# Patient Record
Sex: Female | Born: 1960 | Race: White | Hispanic: No | Marital: Married | Smoking: Former smoker
Health system: Southern US, Community
[De-identification: ages and names within clinical notes are randomized; demographics above are authoritative.]

## PROBLEM LIST (undated history)

## (undated) DIAGNOSIS — M810 Age-related osteoporosis without current pathological fracture: Secondary | ICD-10-CM

## (undated) DIAGNOSIS — N903 Dysplasia of vulva, unspecified: Secondary | ICD-10-CM

## (undated) DIAGNOSIS — I10 Essential (primary) hypertension: Secondary | ICD-10-CM

## (undated) HISTORY — DX: Age-related osteoporosis without current pathological fracture: M81.0

## (undated) HISTORY — PX: CATARACT EXTRACTION, BILATERAL: SHX1313

## (undated) HISTORY — DX: Dysplasia of vulva, unspecified: N90.3

## (undated) HISTORY — DX: Essential (primary) hypertension: I10

---

## 1996-10-01 HISTORY — PX: TUBAL LIGATION: SHX77

## 1996-10-01 HISTORY — PX: FOOT GANGLION EXCISION: SHX1660

## 1999-05-22 ENCOUNTER — Other Ambulatory Visit: Admission: RE | Admit: 1999-05-22 | Discharge: 1999-05-22 | Payer: Self-pay | Admitting: Gynecology

## 1999-06-08 ENCOUNTER — Other Ambulatory Visit: Admission: RE | Admit: 1999-06-08 | Discharge: 1999-06-08 | Payer: Self-pay | Admitting: Gynecology

## 1999-06-08 ENCOUNTER — Encounter (INDEPENDENT_AMBULATORY_CARE_PROVIDER_SITE_OTHER): Payer: Self-pay

## 2000-06-13 ENCOUNTER — Other Ambulatory Visit: Admission: RE | Admit: 2000-06-13 | Discharge: 2000-06-13 | Payer: Self-pay | Admitting: Gynecology

## 2001-06-17 ENCOUNTER — Other Ambulatory Visit: Admission: RE | Admit: 2001-06-17 | Discharge: 2001-06-17 | Payer: Self-pay | Admitting: Gynecology

## 2002-06-03 ENCOUNTER — Other Ambulatory Visit: Admission: RE | Admit: 2002-06-03 | Discharge: 2002-06-03 | Payer: Self-pay | Admitting: Gynecology

## 2003-10-26 ENCOUNTER — Other Ambulatory Visit: Admission: RE | Admit: 2003-10-26 | Discharge: 2003-10-26 | Payer: Self-pay | Admitting: Gynecology

## 2004-12-05 ENCOUNTER — Other Ambulatory Visit: Admission: RE | Admit: 2004-12-05 | Discharge: 2004-12-05 | Payer: Self-pay | Admitting: Gynecology

## 2006-01-17 ENCOUNTER — Other Ambulatory Visit: Admission: RE | Admit: 2006-01-17 | Discharge: 2006-01-17 | Payer: Self-pay | Admitting: Gynecology

## 2006-02-12 ENCOUNTER — Ambulatory Visit: Admission: RE | Admit: 2006-02-12 | Discharge: 2006-02-12 | Payer: Self-pay | Admitting: Gynecologic Oncology

## 2006-02-26 ENCOUNTER — Ambulatory Visit (HOSPITAL_COMMUNITY): Admission: RE | Admit: 2006-02-26 | Discharge: 2006-02-26 | Payer: Self-pay | Admitting: Gynecologic Oncology

## 2006-02-26 ENCOUNTER — Encounter (INDEPENDENT_AMBULATORY_CARE_PROVIDER_SITE_OTHER): Payer: Self-pay | Admitting: *Deleted

## 2006-03-27 ENCOUNTER — Ambulatory Visit: Admission: RE | Admit: 2006-03-27 | Discharge: 2006-03-27 | Payer: Self-pay | Admitting: Gynecology

## 2006-04-17 ENCOUNTER — Ambulatory Visit: Admission: RE | Admit: 2006-04-17 | Discharge: 2006-04-17 | Payer: Self-pay | Admitting: Gynecologic Oncology

## 2006-08-13 ENCOUNTER — Ambulatory Visit: Admission: RE | Admit: 2006-08-13 | Discharge: 2006-08-13 | Payer: Self-pay | Admitting: Gynecologic Oncology

## 2006-09-03 ENCOUNTER — Ambulatory Visit (HOSPITAL_COMMUNITY): Admission: RE | Admit: 2006-09-03 | Discharge: 2006-09-03 | Payer: Self-pay | Admitting: Gynecologic Oncology

## 2006-09-03 ENCOUNTER — Encounter (INDEPENDENT_AMBULATORY_CARE_PROVIDER_SITE_OTHER): Payer: Self-pay | Admitting: *Deleted

## 2006-10-29 ENCOUNTER — Ambulatory Visit: Admission: RE | Admit: 2006-10-29 | Discharge: 2006-10-29 | Payer: Self-pay | Admitting: Gynecologic Oncology

## 2007-02-12 ENCOUNTER — Other Ambulatory Visit: Admission: RE | Admit: 2007-02-12 | Discharge: 2007-02-12 | Payer: Self-pay | Admitting: Gynecology

## 2007-12-09 ENCOUNTER — Ambulatory Visit: Admission: RE | Admit: 2007-12-09 | Discharge: 2007-12-09 | Payer: Self-pay | Admitting: Gynecologic Oncology

## 2008-03-01 ENCOUNTER — Other Ambulatory Visit: Admission: RE | Admit: 2008-03-01 | Discharge: 2008-03-01 | Payer: Self-pay | Admitting: Gynecology

## 2008-05-19 ENCOUNTER — Ambulatory Visit: Admission: RE | Admit: 2008-05-19 | Discharge: 2008-05-19 | Payer: Self-pay | Admitting: Gynecologic Oncology

## 2008-08-18 ENCOUNTER — Ambulatory Visit: Admission: RE | Admit: 2008-08-18 | Discharge: 2008-08-18 | Payer: Self-pay | Admitting: Obstetrics and Gynecology

## 2008-10-01 HISTORY — PX: CO2 LASER APPLICATION: SHX5778

## 2009-04-29 ENCOUNTER — Ambulatory Visit: Admission: RE | Admit: 2009-04-29 | Discharge: 2009-04-29 | Payer: Self-pay | Admitting: Gynecology

## 2009-10-21 ENCOUNTER — Ambulatory Visit: Admission: RE | Admit: 2009-10-21 | Discharge: 2009-10-21 | Payer: Self-pay | Admitting: Gynecology

## 2011-02-13 NOTE — Consult Note (Signed)
NAME:  [name], [name]                ACCOUNT NO.:  [id number]   MEDICAL RECORD NO.:  [id number]          PATIENT TYPE:  OUT   LOCATION:  GYN                          FACILITY:  [facility]   PHYSICIAN:  De Blanch, M.D.DATE OF BIRTH:  02-24-1961   DATE OF CONSULTATION:  DATE OF DISCHARGE:                                 CONSULTATION   CHIEF COMPLAINT:  Vulvar intraepithelial neoplasia grade III.   INTERVAL HISTORY:  The patient turns today having recently seen Dr.  Teodora Medici.  On that visit, a small white lesion was noted on the  patient's posterior left vulva.  A punch biopsy was performed.  This  shows VIN III.  The patient has had good healing from the biopsy and has  no complaints.  She specifically denies any new vulvar lesions for any  pruritus.   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  She had extensive involvement of multiple  hemorrhoids undergoing extensive laser ablation of the anus and vulva in  May 2007.  She had some regrowth of apparent condylomata and was  initially treated with Condylox for approximately 3 months.  However,  with persistent lesion, she underwent laser vaporization in December  2007.  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   PAST MEDICAL HISTORY:  Medical illnesses:  None.  The patient is not  immunosuppressed.   PAST SURGICAL HISTORY:  1. Cesarean section.  2. Tubal ligation.   CURRENT MEDICATIONS:  1. Calcium supplement.  2. Fish oil.   SOCIAL HISTORY:  The patient is married.  She smokes approximately half  pack a day.  She comes accompanied by her husband who recently had open  heart surgery.   FAMILY HISTORY:  Mother with colon cancer.  Grandmother with breast  cancer.  Father with prostate cancer.   REVIEW OF SYSTEMS:  A  10-point coverage review of systems negative  except as noted above.   PHYSICAL EXAMINATION:  VITAL SIGNS:  Weight 148 pounds.  GENERAL:  The patient is a healthy white female in no acute distress.  HEENT:  Negative.  NECK:  Supple without thyromegaly.  ABDOMEN:  Soft, nontender.  No mass, organomegaly, ascites or hernias  noted.  There is no supraclavicular or inguinal adenopathy.  PELVIC:  EG/BUS inspected carefully before and after application of  acetic acid.  There are no lesions noted.  She does have changes  consistent with laser vaporization and excellent re-epithelialization  especially on the left posterior vulva.  The punch biopsy site is  located in the middle of this area.  There are no other areas of  abnormality noted on today's exam.  Careful inspection of anus also  appears to be normal.   IMPRESSION:  Recurrent vulvar intraepithelial neoplasia.  Biopsy  confirmed by Dr. Chevis Pretty on October 30, 2007.  On today's exam.  I see no  lesions and therefore I do not have  the target lesion to excise or  ablate.  I would recommend the patient continue to follow closely at 6-  month intervals.      De Blanch, M.D.  Electronically Signed     DC/MEDQ  D:  12/09/2007  T:  12/11/2007  Job:  [id number]   cc:   Leatha Gilding. Mezer, M.D.  Fax: [phone]   Telford Nab, R.N.  [address]  [place], [place] [address]

## 2011-02-13 NOTE — Consult Note (Signed)
NAME:  [name], [name]                ACCOUNT NO.:  [id number]   MEDICAL RECORD NO.:  [id number]          PATIENT TYPE:  OUT   LOCATION:  GYN                          FACILITY:  [facility]   PHYSICIAN:  John T. Kyla Balzarine, M.D.    DATE OF BIRTH:  1960/10/18   DATE OF CONSULTATION:  05/19/2008  DATE OF DISCHARGE:                                 CONSULTATION   CHIEF COMPLAINT:  Recurrent/persistent VIN III.   INTERVAL HISTORY:  The patient recently saw Dr. Chevis Pretty and was noted to  have a small white lesion of the vulva with biopsy positive for VIN III  again.  The patient noted poor healing at the biopsy site and some  pruritus associated with that but otherwise has been symptomatic and has  not noted any lesions.   HISTORY OF PRESENT ILLNESS:  This patient has multifocal vulvar and  perineal intraepithelial neoplasia, high-grade, dating to May 2007.  She  had extensive involvement of multiple hemorrhoids, undergoing extensive  laser ablation of the anus and vulva in May 2007.  She had some regrowth  of apparent condylomata, initially treated with Condylox for  approximately 3 months; however, with persistent lesions, she underwent  laser vaporization in December 2007.  She had a followup biopsy in March  2009 with VIN III and Dr. Nelwyn Salisbury examination at that time  revealed no residual dysplasia.  She was seen by Dr. Chevis Pretty recently, and  his biopsy is as above.   PAST MEDICAL HISTORY:  No major medical illnesses or immunosuppression.  Prior cesarean section, tubal ligation and laser vaporizations as above.   MEDICATIONS:  Calcium, multiple vitamins, and fish oil.   ALLERGIES:  None known.   PERSONAL AND SOCIAL HISTORY:  Married.  Smokes 1/2 pack per day and  admits social ethanol.   FAMILY HISTORY:  Mother with colon cancer and grandmother with breast  cancer, father with prostate cancer.   REVIEW OF SYSTEMS:  Ten-point survey negative except as noted above.   PHYSICAL  EXAMINATION:  VITAL SIGNS:  Stable and afebrile as recorded in  clinic chart.  GENERAL:  The patient is alert and oriented x 3, in no acute distress.  HEENT:  ENT is negative and there is no supraclavicular or inguinal  lymphadenopathy.  BACK:  No spinous or CVA tenderness.  ABDOMEN:  Soft and benign with no hernia, ascites, mass or organomegaly.  No skin lesions of trunk or extremities.  PELVIC:  External genitalia and BUS are normal to inspection and  palpation.  Bladder and urethra are normal, including cervix.  Bimanual  and rectovaginal examinations reveal a small uterus and normal cervix  with no adnexal pathology.   Inspection of the vulva with acetic acid reveals patchy bland acetowhite  lesions with a healing biopsy site.  The anus and urethra are negative.   ASSESSMENT:  Recurrent multifocal lower genital tract dysplasia with  most recent biopsy positive for VIN III.  The patient is a smoker and  was encouraged in smoking cessation.  We plan another trial of Aldara  with reinspection in approximately 3 months and  laser vaporization if  there is no improvement.  This has approximately a 30% to 60% response  rate in treating VIN III, and it would be worthwhile to attempt to  stimulate her immune system with this treatment.      John T. Kyla Balzarine, M.D.  Electronically Signed     JTS/MEDQ  D:  05/20/2008  T:  05/20/2008  Job:  [id number]   cc:   Leatha Gilding. Mezer, M.D.  Fax: [phone]   Telford Nab, R.N.  [address]  [place], [place] [address]

## 2011-02-13 NOTE — Consult Note (Signed)
NAME:  [name], [name]                ACCOUNT NO.:  [id number]   MEDICAL RECORD NO.:  [id number]          PATIENT TYPE:  OUT   LOCATION:  GYN                          FACILITY:  [facility]   PHYSICIAN:  John T. Kyla Balzarine, M.D.    DATE OF BIRTH:  [birth year]   DATE OF CONSULTATION:  DATE OF DISCHARGE:                                 CONSULTATION   CHIEF COMPLAINT:  Recurrent/persistent VIN III.   INTERVAL HISTORY:  This patient had multifocal vulvar and  perianal/perineal intraepithelial neoplasia, high grade first diagnosed  in May 2007.  She had multiple hemorrhoidal involvement and underwent  extensive laser ablation of the anus and vulva in May 2007.  She had  some regrowth of  apparent condylomata initially treated with Condylox  for 3 months, but with persistent lesions she underwent laser  vaporization in December 2007.  She had a follow-up biopsy in March 2009  with VIN 3, but Dr. Nelwyn Salisbury examination at that time revealed  no residual dysplasia.  Biopsy per Dr. Arnell Asal in July was VIN 3.  At that  time she was found to have patchy bland acetowhite lesions with healing  biopsy site and negative anus and urethra.  I recommended topical  therapy with Aldara in an attempt to boost immune status.  The patient  was fairly compliant noticing irritation only during the last week of  her course.  She has been aware of skin smoothing out.   PAST MEDICAL HISTORY:  No major medical illnesses or immunosuppression.  Prior cesarean section, tubal ligation, and laser vaporizations as  above.   MEDICATIONS:  Calcium, multivitamins, and fish oil.   ALLERGIES:  None known.   PERSONAL AND SOCIAL HISTORY:  Married, smokes 1/2 pack per day, and  admits social ethanol.   FAMILY HISTORY:  Mother with colon cancer and grandmother with breast  cancer, father with prostate cancer.   REVIEW OF SYSTEMS:  A 10-point survey negative except as above.   PHYSICAL EXAMINATION:  VITAL SIGNS:  Stable and  afebrile as recorded in  WEXIS with weight 146 pounds.  The patient is alert and anxious, oriented x3.  LYMPH SURVEY;  Negative for pathologic lymphadenopathy.  BACK:  No spinous or CVA tenderness.  ABDOMEN:  Soft and benign with no hernia, ascites, mass or organomegaly.  No skin lesions on trunk or extremities.  PELVIC:  External genitalia and BUS normal to inspection and palpation.  Bladder and urethra are normal including the cervix.  Bimanual and  rectovaginal examinations reveal small uterus and normal cervix with no  adnexal pathology.  Close inspection of the vulva again with acetic acid  staining reveals nonspecific pattern.  There are no grossly acetowhite  lesions.   ASSESSMENT:  Recurrent multifocal lower genital tract dysplasia post  Aldara therapy.   PLAN:  We will reevaluate band makeup in the next couple of months to  determine when she could be seen.  In the meantime, I believe that she  should be evaluated by Dr. Arnell Asal in approximately 6 months, and we could  see her back for follow-up in 1  year if she does not have clinically  apparent VIN 3.  I had a long discussion with the patient and husband  regarding indications for treatment in the future, and I answered  multiple questions; they appeared satisfied with this encounter.      John T. Kyla Balzarine, M.D.  Electronically Signed     JTS/MEDQ  D:  08/18/2008  T:  08/19/2008  Job:  [id number]   cc:   Filbert Berthold, M.D.  Fax: [phone]   Telford Nab, R.N.  [address]  [place], [place] [address]

## 2011-02-13 NOTE — Consult Note (Signed)
NAME:  [name], [name]                ACCOUNT NO.:  [id number]   MEDICAL RECORD NO.:  [id number]          PATIENT TYPE:  OUT   LOCATION:  GYN                          FACILITY:  [facility]   PHYSICIAN:  De Blanch, M.D.DATE OF BIRTH:  [birth year]   DATE OF CONSULTATION:  04/29/2009  DATE OF DISCHARGE:  04/29/2009                                 CONSULTATION   CHIEF COMPLAINT:  Vulvar dysplasia.   INTERVAL HISTORY:  The patient returns today having had a vulvar biopsy  by her primary gynecologist, Dr. Teodora Medici on April 01, 2009, which  revealed vulvar intraepithelial neoplasia (warty type).   The patient herself is asymptomatic.  She specifically denies pruritus,  pain, or any lesions which she is aware of.  Apparently this lesion was  identified by Dr. Chevis Pretty upon performing colposcopy.   HISTORY OF PRESENT ILLNESS:  The patient has vulvar dysplasia first  diagnosed in May of 2007.  She underwent extensive laser vaporization of  the vulva and anus in May of 2007.  She did have some regrowth of some  condylomata which were initially treated with Condylox and then  subsequent laser vaporization in December of 2007.  Repeat biopsy in  March 2009 showed VIN 3, although no obvious lesions could be seen.  Subsequent biopsies in July of 2009 showed persistent VIN 3, which was  treated with Aldara.   PAST MEDICAL HISTORY:  Medical Illnesses:  None.  The patient denies any  use of any immunosuppressive drugs.   OBSTETRICAL HISTORY:  Gravida 1.   PAST SURGICAL HISTORY:  1. Laser vaporization of the vulva.  2. Cesarean section.  3. Bilateral tubal ligation.   CURRENT MEDICATIONS:  Calcium, multivitamins, and fish oil.   DRUG ALLERGIES:  None.   PERSONAL AND SOCIAL HISTORY:  The patient is married.  She smokes 1/2  pack per day.   FAMILY HISTORY:  Mother with colon cancer.  Grandmother with breast  cancer.  Father with prostate cancer.   REVIEW OF SYSTEMS:  A 10-point  comprehensive review of systems negative  except as noted above.   PHYSICAL EXAMINATION:  VITAL SIGNS:  Weight 149 pounds, blood pressure  130/70.  GENERAL:  The patient is a healthy white female in no acute distress.  HEENT:  Negative.  NECK:  Supple without thyromegaly.  There is no supraclavicular or  inguinal adenopathy.  ABDOMEN:  Soft, nontender.  No mass, organomegaly, are ascites are  noted.  PELVIC EXAM:  EG, BUS:  On careful examination of the vulva, there are  some very small nevi.  No obvious lesions are noted.  Vagina is clean  and well-supported.  Cervix appears normal.   PROCEDURE NOTE:  Colposcopic examination the vulva was performed after  applying acetic acid.  Once again I am unable to identify any lesions,  although I do identify the recent biopsy site performed by Dr. Chevis Pretty.   IMPRESSION:  Moderate dysplasia of the vulva on recent biopsy with no  obvious lesion at this time.  The patient is entirely asymptomatic and  therefore I would recommend that she have  a repeat examination in  approximately 6 months.      De Blanch, M.D.  Electronically Signed     DC/MEDQ  D:  05/06/2009  T:  05/06/2009  Job:  [id number]   cc:   Leatha Gilding. Mezer, M.D.  Fax: [phone]   Telford Nab, R.N.  [address]  [place], [place] [address]

## 2011-02-16 NOTE — Op Note (Signed)
NAME:  [name], [name]                ACCOUNT NO.:  [id number]   MEDICAL RECORD NO.:  [id number]          PATIENT TYPE:  AMB   LOCATION:  DAY                          FACILITY:  [facility]   PHYSICIAN:  John T. Kyla Balzarine, M.D.    DATE OF BIRTH:  06-01-1961   DATE OF PROCEDURE:  09/03/2006  DATE OF DISCHARGE:                               OPERATIVE REPORT   PREOPERATIVE DIAGNOSIS:  Multifocal vulvar and perianal intraepithelial  neoplasia.   POSTOPERATIVE DIAGNOSIS:  Anal condylomata.   PROCEDURE:  Laser ablation of multiple anal condylomata/intraepithelial  neoplasia lesions with biopsy of perianal lesion.   ANESTHESIA:  General with local infiltration of Xylocaine and Marcaine.   DESCRIPTION OF FINDINGS AND INDICATIONS FOR SURGERY:  This patient  underwent extensive laser ablation of the anus and vulva in May 2007.  She had regrowth of an apparent condyloma at approximately 3 o'clock in  the anal canal; and has been treating with Condylox prior to surgery.  On examination, there was a to 2-to-2.5-cm condyloma involving the anus  at approximately 3 o'clock, along a hemorrhoid with a seedling condyloma  at approximately 9 o'clock, a smaller seedling condyloma at 6 o'clock,  and associated acetowhite epithelium.  The acetowhite epithelium  extended towards the anal verge.  There were no acetowhite or  condylomatous lesions involving the perineum, vulva, or vaginal  introitus.   DESCRIPTION OF PROCEDURE:  The patient was prepped and draped in the low  lithotomy position using direct placement stirrups; and moistened drapes  were placed around the operative field.  The vulva and anus were  saturated with dilute acetic acid using 4 x 4 sponges with the findings  described above.  Then 10 mL of a 50:50 mix of 1% Xylocaine with 1/4%  Marcaine was injected intradermally, and under the affected areas.  The  CO2 laser using the 2 mm spot-size handpiece; and a setting of 20 watts  continuous was used  to vaporize all visible lesions.  A small biopsy  from the tip of the largest condyloma was submitted for histopathology.  All lesions were ablated to the second surgical plane with a margin of  at least 5 mm of normal skin beyond visible lesions.  This resulted in  essentially ablating the skin from approximately 2 o'clock around to 10  o'clock at the anal opening.  The patient tolerated the procedure well;  and was returned to recovery room.   ESTIMATED BLOOD LOSS:  Minimal.   SPONGE AND INSTRUMENT COUNTS:  Correct.   DRAINS AND PACKS, ETCETERA:  None.      John T. Kyla Balzarine, M.D.  Electronically Signed     JTS/MEDQ  D:  09/03/2006  T:  09/03/2006  Job:  [id number]   cc:   Leatha Gilding. Mezer, M.D.  Fax: [phone]   Telford Nab, R.N.  [address]  [place], [place] [address]

## 2011-02-16 NOTE — Consult Note (Signed)
NAME:  [name], [name]                ACCOUNT NO.:  [id number]   MEDICAL RECORD NO.:  [id number]          PATIENT TYPE:  OUT   LOCATION:  GYN                          FACILITY:  [facility]   PHYSICIAN:  John T. Kyla Balzarine, M.D.    DATE OF BIRTH:  11/04/1960   DATE OF CONSULTATION:  04/17/2006  DATE OF DISCHARGE:                                   CONSULTATION   CHIEF COMPLAINT:  Followup of vulvar and anal dysplasia.   HISTORY OF PRESENT ILLNESS:  The patient presented with multifocal vulvar  and anal intraepithelial neoplasia in May 2007.  She had extensive  involvement of multiple hemorrhoids and underwent extensive laser  vaporization of anus and vulva on Feb 26, 2006.  She had a relatively  uncomplicated postoperative course and has become aware of an apparent  condyloma at approximately 3 o'clock in the anal canal as she had an episode  of diarrhea from food poisoning or gastroenteritis.  She has resumed full  activity otherwise.   Past history, personal social history, family history are unchanged compared  to Feb 12, 2006.   REVIEW OF SYSTEMS:  Negative other than noted above.   EXAMINATION:  Weight 147 pounds.  Vital signs stable.  The patient is in no  acute distress, alert and oriented.  External genitalia and BUS reveal  complete healing of the vaporization sites with no areas of suspicious white  epithelium.  Inspection of the anus reveals external hemorrhoids.  At 3-4  o'clock there is a 1 x 1.5 cm condyloma but no others condylomatous lesions.   ASSESSMENT:  Extensive vaginal intraepithelial neoplasia and anal  intraepithelial neoplasia status post laser vaporization.  It should be  noted that her prior anal biopsy was compatible with low-grade  intraepithelial neoplasia.   PLAN:  The patient will use Condylox t.i.w. to the lesion for 2 months and  will return for evaluation approximately 1 month after she has completed  treatment.  Of note, she will be [place] for [place] in  [place] and will  not start Condylox until August.      John T. Kyla Balzarine, M.D.  Electronically Signed     JTS/MEDQ  D:  04/17/2006  T:  04/17/2006  Job:  [id number]   cc:   Leatha Gilding. Mezer, M.D.  Fax: [phone]   Telford Nab, R.N.  [address]  [place], [place] [address]

## 2011-02-16 NOTE — Consult Note (Signed)
NAME:  KALYSTA, KNEISLEY                ACCOUNT NO.:  [id number]   MEDICAL RECORD NO.:  [id number]          PATIENT TYPE:  OUT   LOCATION:  GYN                          FACILITY:  [facility]   PHYSICIAN:  De Blanch, M.D.DATE OF BIRTH:  1961/03/14   DATE OF CONSULTATION:  03/27/2006  DATE OF DISCHARGE:                                   CONSULTATION   CHIEF COMPLAINT:  Vulvar dysplasia, anal dysplasia.   INTERVAL HISTORY:  Patient underwent extensive laser vaporization of multi  focal VIN III and anal intraepithelial neoplasia on Feb 26, 2006 by Dr.  Kyla Balzarine.  She has had a relatively uncomplicated postoperative course and  feels like she is healing well.  She is not having any bleeding.  She has  minimal discomfort at the anus and introitus.  She is no longer using sitz  baths.  She has become fully active playing golf, going to the swimming pool  and other normal activities.  She has not resumed sexual intercourse.   HISTORY OF PRESENT ILLNESS:  Reviewed and documented in Dr. Calton Dach note of  Feb 12, 2006.   Past medical history, past surgical history, current medications, social  history, family history are also reviewed and unchanged from Feb 12, 2006.   REVIEW OF SYSTEMS:  10-point comprehensive review of systems is negative  except for symptoms noted above.   PHYSICAL EXAMINATION:  VITAL SIGNS:  Weight 148 pounds, blood pressure  118/76.  GENERAL:  The patient is a healthy white female in no acute distress.  PELVIC:  EGBUS shows the vulvar laser areas have healed nearly completely.  There is one area which is still granulating on the left lower labia minora.  Inspection of the anus shows multiple hemorrhoids.  There is one area which  is at approximately 5 o'clock which looks somewhat more exophytic as if  there is some persistent dysplasia.  There is also an area of granulation at  12 o'clock.   IMPRESSION:  Extensive vulvar and anal intraepithelial neoplasia status post  laser vaporization.  The patient is healing well, but not completely healed.  She will return to see Dr. Kyla Balzarine in not month for reevaluation.  It is  certainly possible that the area in the anus will require some additional  treatment if it appears to be persistent disease.   The patient's pathology report is reviewed with her.      De Blanch, M.D.  Electronically Signed     DC/MEDQ  D:  03/27/2006  T:  03/27/2006  Job:  [id number]   cc:   Leatha Gilding. Mezer, M.D.  Fax: [phone]   Telford Nab, R.N.  [address]  [place], [place] [address]

## 2011-02-16 NOTE — Consult Note (Signed)
NAME:  [name], [name]                ACCOUNT NO.:  [id number]   MEDICAL RECORD NO.:  [id number]          PATIENT TYPE:  OUT   LOCATION:  GYN                          FACILITY:  [facility]   PHYSICIAN:  John T. Kyla Balzarine, M.D.    DATE OF BIRTH:  Jun 17, 1961   DATE OF CONSULTATION:  02/12/2006  DATE OF DISCHARGE:                                   CONSULTATION   REFERRING PHYSICIAN:  Zenaida Niece, M.D.   CHIEF COMPLAINT:  This 50 year old woman is seen at the request of Dr.  Jackelyn Knife for recommendations regarding management of VIN-3 associated with  perianal condylomata.   HISTORY OF PRESENT ILLNESS:  The patient has a long-standing history of  normal cytology and has never had genital condylomata. She has colonoscopy  every 2 years because of a family history of colon cancer.  She was  asymptomatic and lesions of the anus and left posterior vulva were noted on  routine exam.  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  The  patient is asymptomatic other than hemorrhoids.   PAST MEDICAL HISTORY:  Significant for no major comorbidities and no  immunosuppressants.   PAST SURGICAL HISTORY:  1.  Status post cesarean section.  2.  BTL.   MEDICATIONS:  Calcium supplementation and fish oil.   SOCIAL HISTORY:  The patient is divorced and remarried.  She admits social  ethanol and one half pack per day tobacco use.   FAMILY HISTORY:  Significant for mother with colon cancer, grandmother with  breast cancer and father with prostate cancer.   REVIEW OF SYSTEMS:  The patient is anxious regarding her diagnosis.  She  relates spontaneous cessation of menses at age 86 and has been on no  hormonal replacement.  She has been under some psychological stress when her  son went to college, but otherwise negative in comprehensive 10 systems.   PHYSICAL EXAMINATION:  VITAL SIGNS:  Stable and afebrile as reflected in the  clinic note.  GENERAL:  The patient is alert and oriented x3 in no acute  distress.  HEENT:  Benign with clear oropharynx.  There is no scleral icterus and  extraocular movements are intact.  NECK:  Supple without goiter or bruits.  LUNGS:  The lung fields clear.  HEART:  Regular with no JVD.  BREASTS:  Breast examination deferred.  BACK:  Back is nontender with no CVA tenderness.  ABDOMEN:  The abdomen is soft and benign with well-healed midline incision.  No ascites, mass, tenderness or hernia.  EXTREMITIES:  Full strength in range of motion without tenderness, clubbing  or edema.  LYMPHS:  Lymph survey negative for pathologic lymphadenopathy.  PELVIC:  External genitalia and BUS normal to inspection and palpation with  the exception of a 1.5  x 2 cm area of leukoplakia in the posterior inner  surface of the left labia minus.  There is a cluster condyloma involving  hemorrhoidal tags without extension into the anal canal.  No ulcerative  lesions or lesions suspicious for invasion.   ASSESSMENT:  Vulvar intraepithelial neoplasia-3 and perianal condylomata.   RECOMMENDATIONS:  I had discussion with the patient and husband regarding  options for treatment.  I recommended laser vaporization over surgical  resection.  The patient is agreeable with this plan.  I discussed risks and  benefits.  Surgery will be scheduled as an outpatient in the near future.      John T. Kyla Balzarine, M.D.  Electronically Signed     JTS/MEDQ  D:  02/12/2006  T:  02/12/2006  Job:  [id number]   cc:   Zenaida Niece, M.D.  Fax: [phone]   Telford Nab, R.N.  [address]  [place], [place] [address]

## 2011-02-16 NOTE — Consult Note (Signed)
NAME:  [name], [name]                ACCOUNT NO.:  [id number]   MEDICAL RECORD NO.:  [id number]          PATIENT TYPE:  OUT   LOCATION:  GYN                          FACILITY:  [facility]   PHYSICIAN:  John T. Kyla Balzarine, M.D.    DATE OF BIRTH:  [birth year]   DATE OF CONSULTATION:  DATE OF DISCHARGE:                                 CONSULTATION   .   CHIEF COMPLAINT:  Followup after extensive perianal and vulvar laser  vaporization   HISTORY OF PRESENT ILLNESS:  This patient underwent a second laser  vaporization on December 4, treating multiple and vulvar perianal  condylomas.  Biopsy revealed focal high-grade (VIN II) and background of  low-grade vulvar and intraepithelial neoplasia with no evidence of  invasion.  She had a protracted and uncomfortable recovery because of  the perianal nature of her lesions.  These have since healed and she  presents for reevaluation.   EXAM:  Weight 148.5. Blood pressure 120/78.  Inspection of the vulva reveals a well-healed operative site.  She does  have a prominent anterior hemorrhoid but no condylomatous or acetowhite  lesions noted.  Vulvar scan is likewise normal.   ASSESSMENT:  Vulval intra-epithelial neoplasia post laser vaporization  for recurrent disease.   PLAN:  The patient reassured regarding her current evaluation.  She  should have semi-annual examinations and cytology; to spare her travel,  this could be carried out by __________.      John T. Kyla Balzarine, M.D.  Electronically Signed     JTS/MEDQ  D:  10/29/2006  T:  10/29/2006  Job:  [id number]   cc:   Telford Nab, R.N.  [address]  [place], [place] [address]   Dr. Arnell Asal

## 2011-02-16 NOTE — Op Note (Signed)
NAME:  [name], [name]                ACCOUNT NO.:  [id number]   MEDICAL RECORD NO.:  [id number]          PATIENT TYPE:  AMB   LOCATION:  DAY                          FACILITY:  [facility]   PHYSICIAN:  John T. Kyla Balzarine, M.D.    DATE OF BIRTH:  [birth year]   DATE OF PROCEDURE:  02/26/2006  DATE OF DISCHARGE:                                 OPERATIVE REPORT   SURGEON:  Ronita Hipps M.D.   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   PROCEDURE:  Laser vaporization of vulvar and perianal lesions, extensive.   ANESTHESIA:  General with LMA and local with 0.25% Marcaine.   DESCRIPTION OF FINDINGS AND INDICATIONS FOR SURGERY:  This 50 year old woman  has biopsy-documented VIN 3 involving the posterior left vulva.  Multiple  hemorrhoidal tags have condylomatous and flap lesions compatible with AIN.  The laser vaporization was performed with the CO2 laser, ablating all  visible lesions.   DESCRIPTION OF PROCEDURE:  The patient was prepped and draped using wet lap  pads and green towels in the lithotomy position.  The bladder was sterilely  catheterized for minimal urine.  After staining with acetic acid, a 3 x 4 cm  patch of acetowhite epithelium compatible with VIN 3 was seen to involve the  posterior left labia minora, majora and left lateral perineum.  In the anal  region were multiple condylomatous lesions involving hemorrhoidal tags.  The  largest was pedunculated, measuring approximately 1 cm in largest diameter  and was excised with the laser and submitted for biopsy.  All sites were  infiltrated with 0.25% Marcaine intradermally and subcutaneously.  The  individual lesions were outlined with the laser and vaporized to the second  surgical plane.  The largest perianal condylomatous lesion was  pedunculated  and the stalk was amputated with the laser.  Adjacent normal skin was  ablated, giving a margin in excess of 5 mm round each lesion.  The patient  tolerated the procedure well and was returned to the recovery room in stable  condition.  Estimated blood loss minimal.   TRANSFUSIONS:  None.   DRAINS, PACKS, ETC.:  None.   SPONGE AND INSTRUMENT COUNTS:  Correct.      John T. Kyla Balzarine, M.D.  Electronically Signed     JTS/MEDQ  D:  02/26/2006  T:  02/26/2006  Job:  [id number]   cc:   Mliss Sax, Dr.   Dagmar Hait, R.N.

## 2011-02-16 NOTE — Op Note (Signed)
NAME:  [name], [name]                ACCOUNT NO.:  [id number]   MEDICAL RECORD NO.:  [id number]          PATIENT TYPE:  OUT   LOCATION:  GYN                          FACILITY:  [facility]   PHYSICIAN:  John T. Kyla Balzarine, M.D.    DATE OF BIRTH:  [birth year]   DATE OF PROCEDURE:  DATE OF DISCHARGE:                               OPERATIVE REPORT   CHIEF COMPLAINT:  Follow-up of vulvar and anal high-grade dysplasia.   HISTORY OF PRESENT ILLNESS:  This patient presented with multifocal  vulvar and perineal intraepithelial neoplasia, high-grade in May 2007.  She had extensive involvement of multiple hemorrhoids undergoing  extensive laser ablation of the anus and vulva on Feb 26, 2006.  She had  regrowth of an apparent condyloma approximately 3 o'clock in the anal  canal and has been using Condylox t.i.w. for 3 months.   PAST HISTORY:  No major comorbidities and no immunosuppressants.  Status  post cesarean section and BTL.   MEDICATIONS:  Condylox as above, calcium supplementation and fish oil.   PERSONAL SOCIAL HISTORY:  Married, admits social ethanol and one half  pack per day tobacco use.   FAMILY HISTORY:  Mother with colon cancer, grandmother with breast  cancer and father with prostate cancer.   REVIEW OF SYSTEMS:  Other than previously noted and as above, negative  and the remainder of 10 systems exam.   Weight 149 pounds, blood pressure 118/70.  The patient is anxious, alert  and oriented x3 in no acute distress.  There is no pathologic  lymphadenopathy.  ENT:  Is clear.  LUNG:  Fields are clear.  HEART:  Sounds regular rate and rhythm without JVD.  There is no back or CVA tenderness.  ABDOMEN:  Is scaphoid soft and benign with no mass, ascites or  organomegaly and no tenderness.  EXTREMITIES:  Have full strength and range of motion without edema.  PELVIC:  External genitalia and BUS are normal to inspection and  palpation.  Inspection of the anus reveals a 2 cm condyloma  involving  the anus at approximately 3 o'clock and a small seedling condyloma  measuring 5 mm at approximately 9 o'clock.  EG and BUS normal.  Bladder  and urethra well supported and vaginal mucosa clear.  Cervix mobile  without lesions.  Bimanual and rectovaginal examinations benign.   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   PLAN:  Had a long discussion with the patient regarding options for  management and recommended laser vaporization as she had quite extensive  disease previously.  We again discussed smoking cessation.  Surgery is  tentatively scheduled for 09/03/2006.      John T. Kyla Balzarine, M.D.  Electronically Signed     JTS/MEDQ  D:  08/13/2006  T:  08/14/2006  Job:  [id number]   cc:   Telford Nab, R.N.  [address]  [place], [place] [address]   Leatha Gilding. Mezer, M.D.  Fax: [phone]

## 2017-10-01 DIAGNOSIS — D7589 Other specified diseases of blood and blood-forming organs: Secondary | ICD-10-CM

## 2017-10-01 HISTORY — DX: Other specified diseases of blood and blood-forming organs: D75.89

## 2019-03-03 ENCOUNTER — Encounter (INDEPENDENT_AMBULATORY_CARE_PROVIDER_SITE_OTHER): Payer: Self-pay

## 2019-03-03 ENCOUNTER — Ambulatory Visit: Payer: Self-pay

## 2019-03-03 ENCOUNTER — Ambulatory Visit: Payer: BLUE CROSS/BLUE SHIELD | Admitting: Rheumatology

## 2019-03-03 ENCOUNTER — Encounter: Payer: Self-pay | Admitting: Rheumatology

## 2019-03-03 ENCOUNTER — Ambulatory Visit (INDEPENDENT_AMBULATORY_CARE_PROVIDER_SITE_OTHER): Payer: BLUE CROSS/BLUE SHIELD

## 2019-03-03 ENCOUNTER — Other Ambulatory Visit: Payer: Self-pay

## 2019-03-03 VITALS — BP 153/98 | HR 73 | Resp 12 | Ht 66.0 in | Wt 166.0 lb

## 2019-03-03 DIAGNOSIS — M25562 Pain in left knee: Secondary | ICD-10-CM

## 2019-03-03 DIAGNOSIS — M79672 Pain in left foot: Secondary | ICD-10-CM

## 2019-03-03 DIAGNOSIS — R2232 Localized swelling, mass and lump, left upper limb: Secondary | ICD-10-CM

## 2019-03-03 DIAGNOSIS — M25542 Pain in joints of left hand: Secondary | ICD-10-CM

## 2019-03-03 DIAGNOSIS — D7589 Other specified diseases of blood and blood-forming organs: Secondary | ICD-10-CM

## 2019-03-03 DIAGNOSIS — M25541 Pain in joints of right hand: Secondary | ICD-10-CM

## 2019-03-03 DIAGNOSIS — M79671 Pain in right foot: Secondary | ICD-10-CM | POA: Diagnosis not present

## 2019-03-03 DIAGNOSIS — G8929 Other chronic pain: Secondary | ICD-10-CM

## 2019-03-03 DIAGNOSIS — M25561 Pain in right knee: Secondary | ICD-10-CM | POA: Diagnosis not present

## 2019-03-03 DIAGNOSIS — M546 Pain in thoracic spine: Secondary | ICD-10-CM

## 2019-03-03 NOTE — Progress Notes (Signed)
Pharmacy Note  Subjective:  Patient presents today to the [facility] to see Dr. Estanislado Pandy for new patient appointment.   Patient seen by the pharmacist for counseling on natural anti-inflammatories and Voltaren gel for osteoarthritis.  Objective: Current Outpatient Medications on File Prior to Visit  Medication Sig Dispense Refill  . Cyanocobalamin (VITAMIN B-12 PO) Take by mouth daily.    Marland Kitchen VITAMIN D PO Take by mouth daily.     No current facility-administered medications on file prior to visit.    Assessment/Plan:   Counseled on the purpose, proper use, and adverse effects of natural anti-inflammatories including upset stomach and increased bleeding risk.  Encouraged patient to add one medication at a time and to include on medication list to monitor for adverse effects and drug interactions.  Given educational handout with recommended doses.  Has patient tried NSAID's previously?  Yes OTC ibuprofen  Patient on the purpose, proper use, and adverse effects of Voltaren gel including headache, increased blood pressure, and risk of GI bleed.  Instructed patient to avoid applying to open skin wound, or on areas of infection, rash, burn, or peeling skin.  Advised  patient wait at least 10 minutes before dressing or wearing gloves and wait at least 1 hour before you bathe or shower.  Counseled patient to wash hands after application and avoid contact with face/eyes.  Advised patient to apply with q-tip if applying to hands to minimize absorption on palms.  Patient given GoodRx coupon to help with cost as it is not routinely covered by insurance.  It is also now available over the counter.  All questions encouraged and answered.  Instructed patient to call with any further questions or concerns.  Mariella Saa, PharmD, [facility] Clinical Pharmacist  03/03/2019 8:46 AM

## 2019-03-03 NOTE — Patient Instructions (Signed)
Knee Exercises Ask your health care provider which exercises are safe for you. Do exercises exactly as told by your health care provider and adjust them as directed. It is normal to feel mild stretching, pulling, tightness, or discomfort as you do these exercises, but you should stop right away if you feel sudden pain or your pain gets worse.Do not begin these exercises until told by your health care provider. STRETCHING AND RANGE OF MOTION EXERCISES These exercises warm up your muscles and joints and improve the movement and flexibility of your knee. These exercises also help to relieve pain, numbness, and tingling. Exercise A: Knee Extension, Prone 1. Lie on your abdomen on a bed. 2. Place your left / right knee just beyond the edge of the surface so your knee is not on the bed. You can put a towel under your left / right thigh just above your knee for comfort. 3. Relax your leg muscles and allow gravity to straighten your knee. You should feel a stretch behind your left / right knee. 4. Hold this position for __________ seconds. 5. Scoot up so your knee is supported between repetitions. Repeat __________ times. Complete this stretch __________ times a day. Exercise B: Knee Flexion, Active  1. Lie on your back with both knees straight. If this causes back discomfort, bend your left / right knee so your foot is flat on the floor. 2. Slowly slide your left / right heel back toward your buttocks until you feel a gentle stretch in the front of your knee or thigh. 3. Hold this position for __________ seconds. 4. Slowly slide your left / right heel back to the starting position. Repeat __________ times. Complete this exercise __________ times a day. Exercise C: Quadriceps, Prone  1. Lie on your abdomen on a firm surface, such as a bed or padded floor. 2. Bend your left / right knee and hold your ankle. If you cannot reach your ankle or pant leg, loop a belt around your foot and grab the belt  instead. 3. Gently pull your heel toward your buttocks. Your knee should not slide out to the side. You should feel a stretch in the front of your thigh and knee. 4. Hold this position for __________ seconds. Repeat __________ times. Complete this stretch __________ times a day. Exercise D: Hamstring, Supine 1. Lie on your back. 2. Loop a belt or towel over the ball of your left / right foot. The ball of your foot is on the walking surface, right under your toes. 3. Straighten your left / right knee and slowly pull on the belt to raise your leg until you feel a gentle stretch behind your knee. ? Do not let your left / right knee bend while you do this. ? Keep your other leg flat on the floor. 4. Hold this position for __________ seconds. Repeat __________ times. Complete this stretch __________ times a day. STRENGTHENING EXERCISES These exercises build strength and endurance in your knee. Endurance is the ability to use your muscles for a long time, even after they get tired. Exercise E: Quadriceps, Isometric  1. Lie on your back with your left / right leg extended and your other knee bent. Put a rolled towel or small pillow under your knee if told by your health care provider. 2. Slowly tense the muscles in the front of your left / right thigh. You should see your kneecap slide up toward your hip or see increased dimpling just above the knee. This   motion will push the back of the knee toward the floor. 3. For __________ seconds, keep the muscle as tight as you can without increasing your pain. 4. Relax the muscles slowly and completely. Repeat __________ times. Complete this exercise __________ times a day. Exercise F: Straight Leg Raises - Quadriceps 1. Lie on your back with your left / right leg extended and your other knee bent. 2. Tense the muscles in the front of your left / right thigh. You should see your kneecap slide up or see increased dimpling just above the knee. Your thigh may  even shake a bit. 3. Keep these muscles tight as you raise your leg 4-6 inches (10-15 cm) off the floor. Do not let your knee bend. 4. Hold this position for __________ seconds. 5. Keep these muscles tense as you lower your leg. 6. Relax your muscles slowly and completely after each repetition. Repeat __________ times. Complete this exercise __________ times a day. Exercise G: Hamstring, Isometric 1. Lie on your back on a firm surface. 2. Bend your left / right knee approximately __________ degrees. 3. Dig your left / right heel into the surface as if you are trying to pull it toward your buttocks. Tighten the muscles in the back of your thighs to dig as hard as you can without increasing any pain. 4. Hold this position for __________ seconds. 5. Release the tension gradually and allow your muscles to relax completely for __________ seconds after each repetition. Repeat __________ times. Complete this exercise __________ times a day. Exercise H: Hamstring Curls  If told by your health care provider, do this exercise while wearing ankle weights. Begin with __________ weights. Then increase the weight by 1 lb (0.5 kg) increments. Do not wear ankle weights that are more than __________. 1. Lie on your abdomen with your legs straight. 2. Bend your left / right knee as far as you can without feeling pain. Keep your hips flat against the floor. 3. Hold this position for __________ seconds. 4. Slowly lower your leg to the starting position.  Repeat __________ times. Complete this exercise __________ times a day. Exercise I: Squats (Quadriceps) 1. Stand in front of a table, with your feet and knees pointing straight ahead. You may rest your hands on the table for balance but not for support. 2. Slowly bend your knees and lower your hips like you are going to sit in a chair. ? Keep your weight over your heels, not over your toes. ? Keep your lower legs upright so they are parallel with the table  legs. ? Do not let your hips go lower than your knees. ? Do not bend lower than told by your health care provider. ? If your knee pain increases, do not bend as low. 3. Hold the squat position for __________ seconds. 4. Slowly push with your legs to return to standing. Do not use your hands to pull yourself to standing. Repeat __________ times. Complete this exercise __________ times a day. Exercise J: Wall Slides (Quadriceps)  1. Lean your back against a smooth wall or door while you walk your feet out 18-24 inches (46-61 cm) from it. 2. Place your feet hip-width apart. 3. Slowly slide down the wall or door until your knees bend __________ degrees. Keep your knees over your heels, not over your toes. Keep your knees in line with your hips. 4. Hold for __________ seconds. Repeat __________ times. Complete this exercise __________ times a day. Exercise K: Straight Leg Raises -   Hip Abductors 1. Lie on your side with your left / right leg in the top position. Lie so your head, shoulder, knee, and hip line up. You may bend your bottom knee to help you keep your balance. 2. Roll your hips slightly forward so your hips are stacked directly over each other and your left / right knee is facing forward. 3. Leading with your heel, lift your top leg 4-6 inches (10-15 cm). You should feel the muscles in your outer hip lifting. ? Do not let your foot drift forward. ? Do not let your knee roll toward the ceiling. 4. Hold this position for __________ seconds. 5. Slowly return your leg to the starting position. 6. Let your muscles relax completely after each repetition. Repeat __________ times. Complete this exercise __________ times a day. Exercise L: Straight Leg Raises - Hip Extensors 1. Lie on your abdomen on a firm surface. You can put a pillow under your hips if that is more comfortable. 2. Tense the muscles in your buttocks and lift your left / right leg about 4-6 inches (10-15 cm). Keep your knee  straight as you lift your leg. 3. Hold this position for __________ seconds. 4. Slowly lower your leg to the starting position. 5. Let your leg relax completely after each repetition. Repeat __________ times. Complete this exercise __________ times a day. This information is not intended to replace advice given to you by your health care provider. Make sure you discuss any questions you have with your health care provider. Document Released: 08/01/2005 Document Revised: 06/11/2016 Document Reviewed: 07/24/2015 [employer] Interactive Patient Education  2018 [employer] Hand Exercises Hand exercises can be helpful to almost anyone. These exercises can strengthen the hands, improve flexibility and movement, and increase blood flow to the hands. These results can make work and daily tasks easier. Hand exercises can be especially helpful for people who have joint pain from arthritis or have nerve damage from overuse (carpal tunnel syndrome). These exercises can also help people who have injured a hand. Most of these hand exercises are fairly gentle stretching routines. You can do them often throughout the day. Still, it is a good idea to ask your health care provider which exercises would be best for you. Warming your hands before exercise may help to reduce stiffness. You can do this with gentle massage or by placing your hands in warm water for 15 minutes. Also, make sure you pay attention to your level of hand pain as you begin an exercise routine. Exercises Knuckle bend Repeat this exercise 5-10 times with each hand. 1. Stand or sit with your arm, hand, and all five fingers pointed straight up. Make sure your wrist is straight. 2. Gently and slowly bend your fingers down and inward until the tips of your fingers are touching the tops of your palm. 3. Hold this position for a few seconds. 4. Extend your fingers out to their original position, all pointing straight up again. Finger fan Repeat this  exercise 5-10 times with each hand. 1. Hold your arm and hand out in front of you. Keep your wrist straight. 2. Squeeze your hand into a fist. 3. Hold this position for a few seconds. 4. Fan out, or spread apart, your hand and fingers as much as possible, stretching every joint fully. Tabletop Repeat this exercise 5-10 times with each hand. 1. Stand or sit with your arm, hand, and all five fingers pointed straight up. Make sure your wrist is straight. 2. Gently   and slowly bend your fingers at the knuckles where they meet the hand until your hand is making an upside-down L shape. Your fingers should form a tabletop. 3. Hold this position for a few seconds. 4. Extend your fingers out to their original position, all pointing straight up again. Making Os Repeat this exercise 5-10 times with each hand. 1. Stand or sit with your arm, hand, and all five fingers pointed straight up. Make sure your wrist is straight. 2. Make an O shape by touching your pointer finger to your thumb. Hold for a few seconds. Then open your hand wide. 3. Repeat this motion with each finger on your hand. Table spread Repeat this exercise 5-10 times with each hand. 1. Place your hand on a table with your palm facing down. Make sure your wrist is straight. 2. Spread your fingers out as much as possible. Hold this position for a few seconds. 3. Slide your fingers back together again. Hold for a few seconds. Ball grip Repeat this exercise 10-15 times with each hand. 1. Hold a tennis ball or another soft ball in your hand. 2. While slowly increasing pressure, squeeze the ball as hard as possible. 3. Squeeze as hard as you can for 3-5 seconds. 4. Relax and repeat.  Wrist curls Repeat this exercise 10-15 times with each hand. 1. Sit in a chair that has armrests. 2. Hold a light weight in your hand, such as a dumbbell that weighs 1-3 pounds (0.5-1.4 kg). Ask your health care provider what weight would be best for you. 3.  Rest your hand just over the end of the chair arm with your palm facing up. 4. Gently pivot your wrist up and down while holding the weight. Do not twist your wrist from side to side. Contact a health care provider if:  Your hand pain or discomfort gets much worse when you do an exercise.  Your hand pain or discomfort does not improve within 2 hours after you exercise. If you have any of these problems, stop doing these exercises right away. Do not do them again unless your health care provider says that you can. Get help right away if:  You develop sudden, severe hand pain. If this happens, stop doing these exercises right away. Do not do them again unless your health care provider says that you can. This information is not intended to replace advice given to you by your health care provider. Make sure you discuss any questions you have with your health care provider. Document Released: 08/29/2015 Document Revised: 01/21/2018 Document Reviewed: 03/28/2015 [employer] Interactive Patient Education  2019 [employer] Back Exercises The following exercises strengthen the muscles that help to support the back. They also help to keep the lower back flexible. Doing these exercises can help to prevent back pain or lessen existing pain. If you have back pain or discomfort, try doing these exercises 2-3 times each day or as told by your health care provider. When the pain goes away, do them once each day, but increase the number of times that you repeat the steps for each exercise (do more repetitions). If you do not have back pain or discomfort, do these exercises once each day or as told by your health care provider. Exercises Single Knee to Chest Repeat these steps 3-5 times for each leg: 1. Lie on your back on a firm bed or the floor with your legs extended. 2. Bring one knee to your chest. Your other leg should stay extended  and in contact with the floor. 3. Hold your knee in place by grabbing your  knee or thigh. 4. Pull on your knee until you feel a gentle stretch in your lower back. 5. Hold the stretch for 10-30 seconds. 6. Slowly release and straighten your leg. Pelvic Tilt Repeat these steps 5-10 times: 1. Lie on your back on a firm bed or the floor with your legs extended. 2. Bend your knees so they are pointing toward the ceiling and your feet are flat on the floor. 3. Tighten your lower abdominal muscles to press your lower back against the floor. This motion will tilt your pelvis so your tailbone points up toward the ceiling instead of pointing to your feet or the floor. 4. With gentle tension and even breathing, hold this position for 5-10 seconds. Cat-Cow Repeat these steps until your lower back becomes more flexible: 1. Get into a hands-and-knees position on a firm surface. Keep your hands under your shoulders, and keep your knees under your hips. You may place padding under your knees for comfort. 2. Let your head hang down, and point your tailbone toward the floor so your lower back becomes rounded like the back of a cat. 3. Hold this position for 5 seconds. 4. Slowly lift your head and point your tailbone up toward the ceiling so your back forms a sagging arch like the back of a cow. 5. Hold this position for 5 seconds.  Press-Ups Repeat these steps 5-10 times: 1. Lie on your abdomen (face-down) on the floor. 2. Place your palms near your head, about shoulder-width apart. 3. While you keep your back as relaxed as possible and keep your hips on the floor, slowly straighten your arms to raise the top half of your body and lift your shoulders. Do not use your back muscles to raise your upper torso. You may adjust the placement of your hands to make yourself more comfortable. 4. Hold this position for 5 seconds while you keep your back relaxed. 5. Slowly return to lying flat on the floor.  Bridges Repeat these steps 10 times: 1. Lie on your back on a firm surface. 2. Bend  your knees so they are pointing toward the ceiling and your feet are flat on the floor. 3. Tighten your buttocks muscles and lift your buttocks off of the floor until your waist is at almost the same height as your knees. You should feel the muscles working in your buttocks and the back of your thighs. If you do not feel these muscles, slide your feet 1-2 inches farther away from your buttocks. 4. Hold this position for 3-5 seconds. 5. Slowly lower your hips to the starting position, and allow your buttocks muscles to relax completely. If this exercise is too easy, try doing it with your arms crossed over your chest. Abdominal Crunches Repeat these steps 5-10 times: 1. Lie on your back on a firm bed or the floor with your legs extended. 2. Bend your knees so they are pointing toward the ceiling and your feet are flat on the floor. 3. Cross your arms over your chest. 4. Tip your chin slightly toward your chest without bending your neck. 5. Tighten your abdominal muscles and slowly raise your trunk (torso) high enough to lift your shoulder blades a tiny bit off of the floor. Avoid raising your torso higher than that, because it can put too much stress on your low back and it does not help to strengthen your abdominal muscles.  6. Slowly return to your starting position. Back Lifts Repeat these steps 5-10 times: 1. Lie on your abdomen (face-down) with your arms at your sides, and rest your forehead on the floor. 2. Tighten the muscles in your legs and your buttocks. 3. Slowly lift your chest off of the floor while you keep your hips pressed to the floor. Keep the back of your head in line with the curve in your back. Your eyes should be looking at the floor. 4. Hold this position for 3-5 seconds. 5. Slowly return to your starting position. Contact a health care provider if:  Your back pain or discomfort gets much worse when you do an exercise.  Your back pain or discomfort does not lessen within  2 hours after you exercise. If you have any of these problems, stop doing these exercises right away. Do not do them again unless your health care provider says that you can. Get help right away if:  You develop sudden, severe back pain. If this happens, stop doing the exercises right away. Do not do them again unless your health care provider says that you can. This information is not intended to replace advice given to you by your health care provider. Make sure you discuss any questions you have with your health care provider. Document Released: 10/25/2004 Document Revised: 01/21/2018 Document Reviewed: 11/11/2014 [employer] Interactive Patient Education  [employer].

## 2019-03-03 NOTE — Progress Notes (Signed)
Office Visit Note  Patient: [name]             Date of Birth: 06/20/1961           MRN: [id number]             PCP: Deborah Chalk, FNP Referring: Deborah Chalk, FNP Visit Date: 03/03/2019 Occupation: retired dental hygeinist  Subjective:  Pain in multiple joints.   History of Present Illness: [name] is a 58 y.o. 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  She has been seen in consultation by her PCP for evaluation of her joint pain.  She states she started having discomfort in her joints many years ago.  She had to retire 5 years ago due to hand pain.  She states initially the pain became manageable but now the hands have been hurting more recently.  She has felt a knot on her left fifth finger which is been extremely painful.  She states sometimes it wakes her up in the middle of the night.  She is also had some thoracic pain over the years for which she is seen a chiropractor and acupuncturist.  She has been told that she has thoracic scoliosis but she manages the discomfort.  She is also had discomfort in her knee joints.  She states she is very active she runs and jogs and it causes discomfort in her knee joints but she has not seen any swelling.  She has discomfort in her feet in the morning.  She has noted that her toes have been changing over time.  Activities of Daily Living:  Patient reports morning stiffness for 0 minute.   Patient Denies nocturnal pain.  Difficulty dressing/grooming: Denies Difficulty climbing stairs: Denies Difficulty getting out of chair: Denies Difficulty using hands for taps, buttons, cutlery, and/or writing: Denies  Review of Systems  Constitutional: Negative for fatigue, night sweats, weight gain and weight loss.  HENT: Negative for mouth sores, trouble swallowing, trouble swallowing, mouth dryness and nose dryness.   Eyes: Positive for dryness. Negative for pain, redness and visual disturbance.  Respiratory: Negative for cough,  shortness of breath and difficulty breathing.   Cardiovascular: Negative for chest pain, palpitations, hypertension, irregular heartbeat and swelling in legs/feet.  Gastrointestinal: Negative for blood in stool, constipation and diarrhea.  Endocrine: Negative for increased urination.  Genitourinary: Negative for vaginal dryness.  Musculoskeletal: Positive for arthralgias and joint pain. Negative for joint swelling, myalgias, muscle weakness, morning stiffness, muscle tenderness and myalgias.  Skin: Negative for color change, rash, hair loss, skin tightness, ulcers and sensitivity to sunlight.  Allergic/Immunologic: Negative for susceptible to infections.  Neurological: Negative for dizziness, memory loss, night sweats and weakness.  Hematological: Negative for swollen glands.  Psychiatric/Behavioral: Negative for depressed mood and sleep disturbance. The patient is not nervous/anxious.     PMFS History:  There are no active problems to display for this patient.   History reviewed. No pertinent past medical history.  Family History  Problem Relation Age of Onset  . Thyroid disease Sister   . Thyroid disease Brother   . Healthy Son    Past Surgical History:  Procedure Laterality Date  . [place]   Social History   Social History Narrative  . Not on file    There is no immunization history on file for this patient.   Objective: Vital Signs: BP (!) 153/98 (BP Location: Right Arm, Patient Position: Sitting, Cuff Size: Normal)   Pulse  73   Resp 12   Ht 5\' 6"  (1.676 m)   Wt 166 lb (75.3 kg)   BMI 26.79 kg/m    Physical Exam Vitals signs and nursing note reviewed.  Constitutional:      Appearance: She is well-developed.  HENT:     Head: Normocephalic and atraumatic.  Eyes:     Conjunctiva/sclera: Conjunctivae normal.  Neck:     Musculoskeletal: Normal range of motion.  Cardiovascular:     Rate and Rhythm: Normal rate and regular rhythm.     Heart sounds:  Normal heart sounds.  Pulmonary:     Effort: Pulmonary effort is normal.     Breath sounds: Normal breath sounds.  Abdominal:     General: Bowel sounds are normal.     Palpations: Abdomen is soft.  Lymphadenopathy:     Cervical: No cervical adenopathy.  Skin:    General: Skin is warm and dry.     Capillary Refill: Capillary refill takes less than 2 seconds.  Neurological:     Mental Status: She is alert and oriented to person, place, and time.  Psychiatric:        Behavior: Behavior normal.      Musculoskeletal Exam: C-spine thoracic and lumbar spine good range of motion without much discomfort.  Mild thoracic scoliosis was noted.  Shoulder joints elbow joints wrist joints with good range of motion.  She is thickening of PIP DIP joints bilaterally.  She had a subcutaneous nodule over her left fifth finger.  Hip joints knee joints ankles MTPs PIPs but good range of motion with no synovitis.  She had bilateral pes cavus and cock-up toes.  CDAI Exam: CDAI Score: Not documented Patient Global Assessment: Not documented; Provider Global Assessment: Not documented Swollen: Not documented; Tender: Not documented Joint Exam   Not documented   There is currently no information documented on the homunculus. Go to the Rheumatology activity and complete the homunculus joint exam.  Investigation: No additional findings.  Imaging: Xr Foot 2 Views Left  Result Date: 03/03/2019 First MTP, PIP and DIP narrowing was noted.  A small calcaneal spur was noted.  No intertarsal joint space narrowing was noted.  No erosive changes were noted. Impression: These findings are consistent with osteoarthritis of the foot.  Xr Foot 2 Views Right  Result Date: 03/03/2019 First MTP, PIP and DIP narrowing was noted.  No erosive changes were noted.  No intertarsal or tibiotalar joint space narrowing was noted. Impression: These findings are consistent with osteoarthritis of the foot.  Xr Hand 2 View Left   Result Date: 03/03/2019 PIP and DIP narrowing was noted.  No intercarpal or radiocarpal joint space narrowing was noted.  No erosive changes were noted. Impression: These findings are consistent with osteoarthritis of the hand.  Xr Hand 2 View Right  Result Date: 03/03/2019 PIP and DIP narrowing was noted.  No intercarpal or radiocarpal joint space narrowing was noted.  No erosive changes were noted. Impression: These findings are consistent with osteoarthritis of the hand.  Xr Knee 3 View Left  Result Date: 03/03/2019 Mild medial compartment narrowing was noted.  Mild patellofemoral narrowing was noted.  No chondrocalcinosis was noted. Impression: These findings are consistent with mild osteoarthritis and mild chondromalacia patella.  Xr Knee 3 View Right  Result Date: 03/03/2019 Mild medial compartment narrowing was noted.  Moderate patellofemoral narrowing was noted.  No chondrocalcinosis was noted. 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  Recent Labs: No results found for: WBC, HGB, PLT, NA, K, CL, CO2, GLUCOSE, BUN, CREATININE, BILITOT, ALKPHOS, AST, ALT, PROT, ALBUMIN, CALCIUM, GFRAA, QFTBGOLD, QFTBGOLDPLUS  Speciality Comments: No specialty comments available.  Procedures:  No procedures performed Allergies: Levofloxacin and Penicillin g   Assessment / Plan:     Visit Diagnoses: Arthralgia of both hands -.  Clinical and x-ray findings are consistent with osteoarthritis of the hands.  Joint protection muscle strengthening was discussed.  Natural anti-inflammatories were discussed.  A handout on hand exercises was given.  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  Knee joint muscle strengthening exercises were given.  She may also benefit from diclofenac gel.  Plan: XR KNEE 3 VIEW RIGHT, XR  KNEE 3 VIEW LEFT  Subcutaneous nodule of finger of left hand - Fifth finger -she has a small freely mobile subcutaneous nodule on her left fifth finger.  Plan: Ambulatory referral to Hand Surgery  Pain in both feet -clinical and radiographic findings are consistent with osteoarthritis of the feet.  She also has bilateral pes cavus.  Proper fitting shoes with orthotics were discussed.  Plan: XR Foot 2 Views Right, XR Foot 2 Views Left  Chronic midline thoracic back pain-patient states that she has seen chiropractor and acupuncturist in the past.  She has mild thoracic scoliosis.  Have given her handout on exercises.  Macrocytosis without anemia   Orders: Orders Placed This Encounter  Procedures  . XR KNEE 3 VIEW RIGHT  . XR KNEE 3 VIEW LEFT  . XR Hand 2 View Right  . XR Hand 2 View Left  . XR Foot 2 Views Right  . XR Foot 2 Views Left  . Ambulatory referral to Hand Surgery   No orders of the defined types were placed in this encounter.   Face-to-face time spent with patient was 50 minutes. Greater than 50% of time was spent in counseling and coordination of care.  Follow-Up Instructions: Return if symptoms worsen or fail to improve.   Bo Merino, MD  Note - This record has been created using Editor, commissioning.  Chart creation errors have been sought, but may not always  have been located. Such creation errors do not reflect on  the standard of medical care.

## 2019-03-16 ENCOUNTER — Other Ambulatory Visit: Payer: Self-pay | Admitting: Orthopedic Surgery

## 2019-03-16 DIAGNOSIS — IMO0002 Reserved for concepts with insufficient information to code with codable children: Secondary | ICD-10-CM

## 2019-03-17 ENCOUNTER — Ambulatory Visit: Payer: Self-pay | Admitting: Rheumatology

## 2019-03-31 ENCOUNTER — Ambulatory Visit: Payer: Self-pay | Admitting: Rheumatology

## 2019-04-29 ENCOUNTER — Ambulatory Visit
Admission: RE | Admit: 2019-04-29 | Discharge: 2019-04-29 | Disposition: A | Discharge status: Home / Self Care | Payer: BLUE CROSS/BLUE SHIELD | Source: Ambulatory Visit | Attending: Orthopedic Surgery | Admitting: Orthopedic Surgery

## 2019-04-29 ENCOUNTER — Other Ambulatory Visit: Payer: Self-pay

## 2019-04-29 DIAGNOSIS — IMO0002 Reserved for concepts with insufficient information to code with codable children: Secondary | ICD-10-CM

## 2019-05-05 ENCOUNTER — Ambulatory Visit: Payer: Self-pay | Admitting: Rheumatology

## 2019-05-14 ENCOUNTER — Other Ambulatory Visit: Payer: Self-pay | Admitting: Orthopedic Surgery

## 2019-05-14 DIAGNOSIS — C479 Malignant neoplasm of peripheral nerves and autonomic nervous system, unspecified: Secondary | ICD-10-CM

## 2019-06-04 DIAGNOSIS — R2232 Localized swelling, mass and lump, left upper limb: Secondary | ICD-10-CM

## 2019-06-04 HISTORY — DX: Localized swelling, mass and lump, left upper limb: R22.32

## 2019-06-11 ENCOUNTER — Other Ambulatory Visit: Payer: BLUE CROSS/BLUE SHIELD

## 2019-06-29 ENCOUNTER — Ambulatory Visit
Admission: RE | Admit: 2019-06-29 | Discharge: 2019-06-29 | Disposition: A | Discharge status: Home / Self Care | Payer: BC Managed Care – PPO | Source: Ambulatory Visit | Attending: Orthopedic Surgery | Admitting: Orthopedic Surgery

## 2019-06-29 DIAGNOSIS — C479 Malignant neoplasm of peripheral nerves and autonomic nervous system, unspecified: Secondary | ICD-10-CM

## 2019-06-29 MED ORDER — GADOBENATE DIMEGLUMINE 529 MG/ML IV SOLN
14.0000 mL | Freq: Once | INTRAVENOUS | Status: AC | PRN
  Administered 2019-06-29: 14 mL via INTRAVENOUS

## 2019-08-18 DIAGNOSIS — I10 Essential (primary) hypertension: Secondary | ICD-10-CM | POA: Insufficient documentation

## 2021-06-22 ENCOUNTER — Encounter: Payer: Self-pay | Admitting: Gynecologic Oncology

## 2021-06-22 ENCOUNTER — Inpatient Hospital Stay: Payer: 59 | Attending: Gynecologic Oncology | Admitting: Gynecologic Oncology

## 2021-06-22 ENCOUNTER — Other Ambulatory Visit: Payer: Self-pay

## 2021-06-22 ENCOUNTER — Other Ambulatory Visit: Payer: Self-pay | Admitting: Gynecologic Oncology

## 2021-06-22 VITALS — BP 153/83 | HR 62 | Temp 98.4°F | Resp 18 | Ht 66.0 in | Wt 168.0 lb

## 2021-06-22 DIAGNOSIS — D071 Carcinoma in situ of vulva: Secondary | ICD-10-CM | POA: Diagnosis not present

## 2021-06-22 MED ORDER — IBUPROFEN 800 MG PO TABS: 800.0000 mg | ORAL_TABLET | Freq: Three times a day (TID) | ORAL | 1 refills | Status: DC | PRN

## 2021-06-22 MED ORDER — SENNOSIDES-DOCUSATE SODIUM 8.6-50 MG PO TABS: 2.0000 | ORAL_TABLET | Freq: Every day | ORAL | 0 refills | Status: DC

## 2021-06-22 MED ORDER — TRAMADOL HCL 50 MG PO TABS: 50.0000 mg | ORAL_TABLET | Freq: Four times a day (QID) | ORAL | 0 refills | Status: DC | PRN

## 2021-06-22 NOTE — H&P (View-Only) (Signed)
GYNECOLOGIC ONCOLOGY NEW PATIENT CONSULTATION   Patient Name: [name]  Patient Age: 60 y.o. 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  The patient and I reviewed her history of vulvar dysplasia, treated approximately 12 - 13 years ago. She now has two biopsy proven areas on her vulva consistent with high-grade dysplasia. In terms of treatment options, we discussed surgical excision versus laser therapy. Given the small size, I think either a surgical excision or CO2 laser would be appropriate. We discussed the benefit with surgical excision of ruling out underlying malignancy. I think it would be feasible to remove both areas with a good negative margin and not cause significant disfiguration of her vulva. We will move forward with plan for excision of both dysplastic areas on her vulva with a back up plan of laser ablation.   A copy of this note was sent to the patient's referring provider.   65 minutes of total time was spent for this patient encounter, including preparation, face-to-face counseling with the patient and coordination of care, and documentation of the encounter.   Jeral Pinch, MD  Division of Gynecologic Oncology  Department of Obstetrics and Gynecology  [facility]  ___________________________________________  Chief Complaint: Chief Complaint  Patient presents with   VIN III (vulvar intraepithelial neoplasia III)    History of Present Illness:  [name] is a 60 y.o. y.o. female who is seen in consultation at the request of Dr. 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  The patient reports a history of vulvar dysplasia previously treated in 2010 by Dr. Clarene Essex.  She underwent 2 different surgeries both of  which involved CO2 laser ablation to an area on her left vulva.  He notes a remote history of having a genital wart removed in her 60 which have left some permanent skin changes on her left posterior vulva.  Patient walks about 5 miles a day and gets some irritation, especially of the left vulva, when she walk and sweats.  She has noted this for the last year or 2.  At her visit about a year ago, it was recommended that she use a barrier cream which to help with the irritation but the spot on her left vulva stayed somewhat erythematous.  On the right vulva, she denies any symptoms or having felt or seen a new lesion.  When she saw Dr. Julien Girt recently, there were lesions noted on both her right as well as left vulva.  She denies any postmenopausal bleeding or discharge.  Patient lives in [place] with her husband.  She is a retired Materials engineer, worked in [employer] for years.  Denies any tobacco use, reports almost daily alcohol use.  PAST MEDICAL HISTORY:  Past Medical History:  Diagnosis Date   Hypertension    Vulvar dysplasia      PAST SURGICAL HISTORY:  Past Surgical History:  Procedure Laterality Date   CATARACT EXTRACTION, BILATERAL     CESAREAN SECTION  2542   CO2 LASER APPLICATION Left 7062   Vulvar   FOOT GANGLION EXCISION Left 1998   x2   TUBAL LIGATION Bilateral 1998    OB/GYN HISTORY:  OB History  Gravida Para Term Preterm AB Living  2            SAB IAB  Ectopic Multiple Live Births               # Outcome Date GA Lbr Len/2nd Weight Sex Delivery Anes PTL Lv  2 Gravida           1 Gravida             No LMP recorded. Patient is postmenopausal.  Age at menarche: 87 Age at menopause: 89 Hx of HRT: Yes, not currently on any hormones Hx of STDs: +HPV Last pap: 06/07/2021: Atypical squamous cells, atrophic pattern with epithelial atypia.  HPV high risk positive, negative for 16, 18, or 45 History of abnormal pap smears: Patient reports that last Pap smear was her  first abnormal 1 she underwent colposcopy on 9/15 with low-grade squamous intraepithelial lesion noted on cervical biopsy, p16 does not show evidence of high-grade dysplasia.  Minute fragments of benign stroma and inflammatory cells noted on ECC.  SCREENING STUDIES:  Last mammogram: 06/2021  Last colonoscopy: 2 years ago  MEDICATIONS: Outpatient Encounter Medications as of 06/22/2021  Medication Sig   ascorbic acid (VITAMIN C) 1000 MG tablet Take 1,000 mg by mouth daily.   cycloSPORINE (RESTASIS) 0.05 % ophthalmic emulsion Place 1 drop into both eyes in the morning and at bedtime.   ibuprofen (ADVIL) 800 MG tablet Take 1 tablet (800 mg total) by mouth every 8 (eight) hours as needed for moderate pain. For AFTER surgery only   Polyethyl Glycol-Propyl Glycol (SYSTANE) 0.4-0.3 % SOLN Apply 1 drop to eye 4 (four) times daily.   senna-docusate (SENOKOT-S) 8.6-50 MG tablet Take 2 tablets by mouth at bedtime. For AFTER surgery, do not take if having diarrhea   traMADol (ULTRAM) 50 MG tablet Take 1 tablet (50 mg total) by mouth every 6 (six) hours as needed for severe pain. For AFTER surgery, do not take and drive   triamterene-hydrochlorothiazide (DYAZIDE) 37.5-25 MG capsule Take 1 capsule by mouth daily.   EPINEPHrine 0.3 mg/0.3 mL IJ SOAJ injection Inject into the muscle. Prn (Patient not taking: Reported on 06/22/2021)   [DISCONTINUED] Cyanocobalamin (VITAMIN B-12 PO) Take by mouth daily.   [DISCONTINUED] VITAMIN D PO Take by mouth daily.   No facility-administered encounter medications on file as of 06/22/2021.    ALLERGIES:  Allergies  Allergen Reactions   Besifloxacin Hcl Other (See Comments)    Eyes turned red   Covid-19 Mrna Vac 5-11y Pfizer [Covid-19 Mrna Vacc (Moderna)] Other (See Comments)    Dizziness, hives, lip swelling   Levofloxacin Anaphylaxis   Penicillin G Anaphylaxis     FAMILY HISTORY:  Family History  Problem Relation Age of Onset   Colon cancer Mother    Prostate  cancer Father    Thyroid disease Sister    Thyroid disease Brother    Breast cancer Maternal Grandmother    Healthy Son    Pancreatic cancer Neg Hx    Ovarian cancer Neg Hx    Endometrial cancer Neg Hx      SOCIAL HISTORY:  Social Connections: Not on file    REVIEW OF SYSTEMS:  Denies appetite changes, fevers, chills, fatigue, unexplained weight changes. Denies hearing loss, neck lumps or masses, mouth sores, ringing in ears or voice changes. Denies cough or wheezing.  Denies shortness of breath. Denies chest pain or palpitations. Denies leg swelling. Denies abdominal distention, pain, blood in stools, constipation, diarrhea, nausea, vomiting, or early satiety. Denies pain with intercourse, dysuria, frequency, hematuria or incontinence. Denies hot flashes, pelvic pain, vaginal bleeding or vaginal  discharge.   Denies joint pain, back pain or muscle pain/cramps. Denies itching, rash, or wounds. Denies dizziness, headaches, numbness or seizures. Denies swollen lymph nodes or glands, denies easy bruising or bleeding. Denies anxiety, depression, confusion, or decreased concentration.  Physical Exam:  Vital Signs for this encounter:  Blood pressure (!) 153/83, pulse 62, temperature 98.4 F (36.9 C), temperature source Oral, resp. rate 18, height 5\' 6"  (1.676 m), weight 168 lb (76.2 kg), SpO2 100 %. Body mass index is 27.12 kg/m. General: Alert, oriented, no acute distress.  HEENT: Normocephalic, atraumatic. Sclera anicteric.  Chest: Clear to auscultation bilaterally. No wheezes, rhonchi, or rales. Cardiovascular: Regular rate and rhythm, no murmurs, rubs, or gallops.  Abdomen: Normoactive bowel sounds. Soft, nondistended, nontender to palpation. No masses or hepatosplenomegaly appreciated. No palpable fluid wave.  Well-healed infraumbilical incision. Extremities: Grossly normal range of motion. Warm, well perfused. No edema bilaterally.  Skin: No rashes or lesions.  Lymphatics: No  cervical, supraclavicular, or inguinal adenopathy.  GU: No discharge or bleeding.  Healing biopsy sites noted along the right labia minora, inferior aspect and along the left posterior labia.  Left biopsy within an approximately 2 x 2 centimeter area with some erythema as well as hyperkeratotic changes.             Bladder/urethra:  No lesions or masses, well supported bladder             Vagina: Mildly atrophic, no lesions or masses noted.             Cervix: Normal appearing, no lesions.             Bimanual and rectovaginal exam: Deferred.  LABORATORY AND RADIOLOGIC DATA:  Outside medical records were reviewed to synthesize the above history, along with the history and physical obtained during the visit.   No results found for: WBC, HGB, HCT, PLT, GLUCOSE, CHOL, TRIG, HDL, LDLDIRECT, LDLCALC, ALT, AST, NA, K, CL, CREATININE, BUN, CO2, TSH, PSA, INR, GLUF, HGBA1C, MICROALBUR  Vulvar biopsies on 06/15/2021: Right vulvar biopsy: VIN 2-3 Left vulvar biopsy: VIN 2-3 Tissue edges are positive for high-grade dysplasia of both biopsies

## 2021-06-22 NOTE — Progress Notes (Signed)
Preop gabapentin prescribed

## 2021-06-22 NOTE — Progress Notes (Signed)
GYNECOLOGIC ONCOLOGY NEW PATIENT CONSULTATION   Patient Name: [name]  Patient Age: 60 y.o. 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  The patient and I reviewed her history of vulvar dysplasia, treated approximately 12 - 13 years ago. She now has two biopsy proven areas on her vulva consistent with high-grade dysplasia. In terms of treatment options, we discussed surgical excision versus laser therapy. Given the small size, I think either a surgical excision or CO2 laser would be appropriate. We discussed the benefit with surgical excision of ruling out underlying malignancy. I think it would be feasible to remove both areas with a good negative margin and not cause significant disfiguration of her vulva. We will move forward with plan for excision of both dysplastic areas on her vulva with a back up plan of laser ablation.   A copy of this note was sent to the patient's referring provider.   65 minutes of total time was spent for this patient encounter, including preparation, face-to-face counseling with the patient and coordination of care, and documentation of the encounter.   Jeral Pinch, MD  Division of Gynecologic Oncology  Department of Obstetrics and Gynecology  [facility]  ___________________________________________  Chief Complaint: Chief Complaint  Patient presents with   VIN III (vulvar intraepithelial neoplasia III)    History of Present Illness:  [name] is a 60 y.o. y.o. female who is seen in consultation at the request of Dr. 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  The patient reports a history of vulvar dysplasia previously treated in 2010 by Dr. Clarene Essex.  She underwent 2 different surgeries both of  which involved CO2 laser ablation to an area on her left vulva.  He notes a remote history of having a genital wart removed in her 62s which have left some permanent skin changes on her left posterior vulva.  Patient walks about 5 miles a day and gets some irritation, especially of the left vulva, when she walk and sweats.  She has noted this for the last year or 2.  At her visit about a year ago, it was recommended that she use a barrier cream which to help with the irritation but the spot on her left vulva stayed somewhat erythematous.  On the right vulva, she denies any symptoms or having felt or seen a new lesion.  When she saw Dr. Julien Girt recently, there were lesions noted on both her right as well as left vulva.  She denies any postmenopausal bleeding or discharge.  Patient lives in [place] with her husband.  She is a retired Materials engineer, worked in [employer] for years.  Denies any tobacco use, reports almost daily alcohol use.  PAST MEDICAL HISTORY:  Past Medical History:  Diagnosis Date   Hypertension    Vulvar dysplasia      PAST SURGICAL HISTORY:  Past Surgical History:  Procedure Laterality Date   CATARACT EXTRACTION, BILATERAL     CESAREAN SECTION  4782   CO2 LASER APPLICATION Left 9562   Vulvar   FOOT GANGLION EXCISION Left 1998   x2   TUBAL LIGATION Bilateral 1998    OB/GYN HISTORY:  OB History  Gravida Para Term Preterm AB Living  2            SAB IAB  Ectopic Multiple Live Births               # Outcome Date GA Lbr Len/2nd Weight Sex Delivery Anes PTL Lv  2 Gravida           1 Gravida             No LMP recorded. Patient is postmenopausal.  Age at menarche: 61 Age at menopause: 52 Hx of HRT: Yes, not currently on any hormones Hx of STDs: +HPV Last pap: 06/07/2021: Atypical squamous cells, atrophic pattern with epithelial atypia.  HPV high risk positive, negative for 16, 18, or 45 History of abnormal pap smears: Patient reports that last Pap smear was her  first abnormal 1 she underwent colposcopy on 9/15 with low-grade squamous intraepithelial lesion noted on cervical biopsy, p16 does not show evidence of high-grade dysplasia.  Minute fragments of benign stroma and inflammatory cells noted on ECC.  SCREENING STUDIES:  Last mammogram: 06/2021  Last colonoscopy: 2 years ago  MEDICATIONS: Outpatient Encounter Medications as of 06/22/2021  Medication Sig   ascorbic acid (VITAMIN C) 1000 MG tablet Take 1,000 mg by mouth daily.   cycloSPORINE (RESTASIS) 0.05 % ophthalmic emulsion Place 1 drop into both eyes in the morning and at bedtime.   ibuprofen (ADVIL) 800 MG tablet Take 1 tablet (800 mg total) by mouth every 8 (eight) hours as needed for moderate pain. For AFTER surgery only   Polyethyl Glycol-Propyl Glycol (SYSTANE) 0.4-0.3 % SOLN Apply 1 drop to eye 4 (four) times daily.   senna-docusate (SENOKOT-S) 8.6-50 MG tablet Take 2 tablets by mouth at bedtime. For AFTER surgery, do not take if having diarrhea   traMADol (ULTRAM) 50 MG tablet Take 1 tablet (50 mg total) by mouth every 6 (six) hours as needed for severe pain. For AFTER surgery, do not take and drive   triamterene-hydrochlorothiazide (DYAZIDE) 37.5-25 MG capsule Take 1 capsule by mouth daily.   EPINEPHrine 0.3 mg/0.3 mL IJ SOAJ injection Inject into the muscle. Prn (Patient not taking: Reported on 06/22/2021)   [DISCONTINUED] Cyanocobalamin (VITAMIN B-12 PO) Take by mouth daily.   [DISCONTINUED] VITAMIN D PO Take by mouth daily.   No facility-administered encounter medications on file as of 06/22/2021.    ALLERGIES:  Allergies  Allergen Reactions   Besifloxacin Hcl Other (See Comments)    Eyes turned red   Covid-19 Mrna Vac 5-11y Pfizer [Covid-19 Mrna Vacc (Moderna)] Other (See Comments)    Dizziness, hives, lip swelling   Levofloxacin Anaphylaxis   Penicillin G Anaphylaxis     FAMILY HISTORY:  Family History  Problem Relation Age of Onset   Colon cancer Mother    Prostate  cancer Father    Thyroid disease Sister    Thyroid disease Brother    Breast cancer Maternal Grandmother    Healthy Son    Pancreatic cancer Neg Hx    Ovarian cancer Neg Hx    Endometrial cancer Neg Hx      SOCIAL HISTORY:  Social Connections: Not on file    REVIEW OF SYSTEMS:  Denies appetite changes, fevers, chills, fatigue, unexplained weight changes. Denies hearing loss, neck lumps or masses, mouth sores, ringing in ears or voice changes. Denies cough or wheezing.  Denies shortness of breath. Denies chest pain or palpitations. Denies leg swelling. Denies abdominal distention, pain, blood in stools, constipation, diarrhea, nausea, vomiting, or early satiety. Denies pain with intercourse, dysuria, frequency, hematuria or incontinence. Denies hot flashes, pelvic pain, vaginal bleeding or vaginal  discharge.   Denies joint pain, back pain or muscle pain/cramps. Denies itching, rash, or wounds. Denies dizziness, headaches, numbness or seizures. Denies swollen lymph nodes or glands, denies easy bruising or bleeding. Denies anxiety, depression, confusion, or decreased concentration.  Physical Exam:  Vital Signs for this encounter:  Blood pressure (!) 153/83, pulse 62, temperature 98.4 F (36.9 C), temperature source Oral, resp. rate 18, height 5\' 6"  (1.676 m), weight 168 lb (76.2 kg), SpO2 100 %. Body mass index is 27.12 kg/m. General: Alert, oriented, no acute distress.  HEENT: Normocephalic, atraumatic. Sclera anicteric.  Chest: Clear to auscultation bilaterally. No wheezes, rhonchi, or rales. Cardiovascular: Regular rate and rhythm, no murmurs, rubs, or gallops.  Abdomen: Normoactive bowel sounds. Soft, nondistended, nontender to palpation. No masses or hepatosplenomegaly appreciated. No palpable fluid wave.  Well-healed infraumbilical incision. Extremities: Grossly normal range of motion. Warm, well perfused. No edema bilaterally.  Skin: No rashes or lesions.  Lymphatics: No  cervical, supraclavicular, or inguinal adenopathy.  GU: No discharge or bleeding.  Healing biopsy sites noted along the right labia minora, inferior aspect and along the left posterior labia.  Left biopsy within an approximately 2 x 2 centimeter area with some erythema as well as hyperkeratotic changes.             Bladder/urethra:  No lesions or masses, well supported bladder             Vagina: Mildly atrophic, no lesions or masses noted.             Cervix: Normal appearing, no lesions.             Bimanual and rectovaginal exam: Deferred.  LABORATORY AND RADIOLOGIC DATA:  Outside medical records were reviewed to synthesize the above history, along with the history and physical obtained during the visit.   No results found for: WBC, HGB, HCT, PLT, GLUCOSE, CHOL, TRIG, HDL, LDLDIRECT, LDLCALC, ALT, AST, NA, K, CL, CREATININE, BUN, CO2, TSH, PSA, INR, GLUF, HGBA1C, MICROALBUR  Vulvar biopsies on 06/15/2021: Right vulvar biopsy: VIN 2-3 Left vulvar biopsy: VIN 2-3 Tissue edges are positive for high-grade dysplasia of both biopsies

## 2021-06-22 NOTE — Patient Instructions (Addendum)
Preparing for your Surgery  Plan for surgery on July 11, 2021  with Dr. Jeral Pinch at [facility]. You will be scheduled for wide local excision of the vulva, possible laser of the vulva.   We recommend purchasing several bags of frozen green peas and dividing them into ziploc bags. You will want to keep these in the freezer and have them ready to use as ice packs to the vulvar incision. Once the ice pack is no longer cold, you can get another from the freezer. The frozen peas mold to your body better than a regular ice pack.   Pre-operative Testing -You will receive a phone call from presurgical testing at [facility] to discuss surgery instructions and arrange for lab work if needed.  -Bring your insurance card, copy of an advanced directive if applicable, medication list.  -You should not be taking blood thinners or aspirin at least ten days prior to surgery unless instructed by your surgeon.  -Do not take supplements such as fish oil (omega 3), red yeast rice, turmeric before your surgery. You want to avoid medications with aspirin in them including headache powders such as BC or Goody's), Excedrin migraine.  Day Before Surgery at [place] will be advised you can have clear liquids up until 3 hours before your surgery.    Your role in recovery Your role is to become active as soon as directed by your doctor, while still giving yourself time to heal.  Rest when you feel tired. You will be asked to do the following in order to speed your recovery:  - Cough and breathe deeply. This helps to clear and expand your lungs and can prevent pneumonia after surgery.  - [place]. Do mild physical activity. Walking or moving your legs help your circulation and body functions return to normal. Do not try to get up or walk alone the first time after surgery.   -If you develop swelling on one leg or the other, pain in the back of your  leg, redness/warmth in one of your legs, please call the office or go to the Emergency Room to have a doppler to rule out a blood clot. For shortness of breath, chest pain-seek care in the Emergency Room as soon as possible. - Actively manage your pain. Managing your pain lets you move in comfort. We will ask you to rate your pain on a scale of zero to 10. It is your responsibility to tell your doctor or nurse where and how much you hurt so your pain can be treated.  Special Considerations -Your final pathology results from surgery should be available around one week after surgery and the results will be relayed to you when available.  -FMLA forms can be faxed to [phone] and please allow 5-7 business days for completion.  Pain Management After Surgery -You have been prescribed your pain medication and bowel regimen medications before surgery so that you can have these available when you are discharged from the hospital. The pain medication is for use ONLY AFTER surgery and a new prescription will not be given.   -Make sure that you have Tylenol and Ibuprofen at home to use on a regular basis after surgery for pain control. We recommend alternating the medications every hour to six hours since they work differently and are processed in the body differently for pain relief.  -Review the attached handout on narcotic use and their risks and side  effects.   Bowel Regimen -You have been prescribed Sennakot-S to take nightly to prevent constipation especially if you are taking the narcotic pain medication intermittently.  It is important to prevent constipation and drink adequate amounts of liquids. You can stop taking this medication when you are not taking pain medication and you are back on your normal bowel routine.  Risks of Surgery Risks of surgery are low but include bleeding, infection, damage to surrounding structures, re-operation, blood clots, and very rarely death.  AFTER SURGERY  INSTRUCTIONS  Return to work:  2-4 weeks if applicable  Activity: 1. Be up and out of the bed during the day.  Take a nap if needed.  You may walk up steps but be careful and use the hand rail.  Stair climbing will tire you more than you think, you may need to stop part way and rest.   2. No lifting or straining for 4 weeks over 10 pounds. No pushing, pulling, straining for 4 weeks.  3. No driving for minimum 24 hours after surgery.  Do not drive if you are taking narcotic pain medicine and make sure that your reaction time has returned.   4. You can shower as soon as the next day after surgery. Shower daily. No tub baths or submerging your body in water until cleared by your surgeon.   5. No sexual activity and nothing in the vagina for 4 weeks.  8. You may experience vulvar spotting and discharge after surgery.  The spotting is normal but if you experience heavy bleeding, call our office.  9. Take Tylenol or ibuprofen first for pain and only use narcotic pain medication for severe pain not relieved by the Tylenol or Ibuprofen.  Monitor your Tylenol intake to a max of 4,000 mg in a 24 hour period. You can alternate these medications after surgery.  Diet: 1. Low sodium Heart Healthy Diet is recommended but you are cleared to resume your normal (before surgery) diet after your procedure.  2. It is safe to use a laxative, such as Miralax or Colace, if you have difficulty moving your bowels. You have been prescribed Sennakot at bedtime every evening to keep bowel movements regular and to prevent constipation.    Wound Care: 1. Keep clean and dry.  Shower daily.  Reasons to call the Doctor: Fever - Oral temperature greater than 100.4 degrees Fahrenheit Foul-smelling vaginal discharge Difficulty urinating Nausea and vomiting Increased pain at the site of the incision that is unrelieved with pain medicine. Difficulty breathing with or without chest pain New calf pain especially if only on  one side Sudden, continuing increased vaginal bleeding with or without clots.   Contacts: For questions or concerns you should contact:  Dr. Jeral Pinch at [phone]  Joylene John, NP at [phone]  After Hours: call [phone] and have the GYN Oncologist paged/contacted (after 5 pm or on the weekends).  Messages sent via mychart are for non-urgent matters and are not responded to after hours so for urgent needs, please call the after hours number.

## 2021-07-04 ENCOUNTER — Other Ambulatory Visit: Payer: Self-pay

## 2021-07-04 ENCOUNTER — Encounter (HOSPITAL_BASED_OUTPATIENT_CLINIC_OR_DEPARTMENT_OTHER): Payer: Self-pay | Admitting: Gynecologic Oncology

## 2021-07-04 NOTE — Progress Notes (Signed)
Spoke w/ via phone for pre-op interview---patient Lab needs dos---- ISTAT, EKG              Lab results------none COVID test -----patient states asymptomatic no test needed Arrive at -------0830 NPO after MN NO Solid Food.  Clear liquids from MN until---0730 Med rec completed Medications to take morning of surgery -----Restasis, Systane Diabetic medication -----n/a Patient instructed no nail polish to be worn day of surgery - clear ok Patient instructed to bring photo id and insurance card day of surgery Patient aware to have Driver (ride ) / caregiver    for 24 hours after surgery - husband [name] Patient Special Instructions -----none Pre-Op special Istructions -----none Patient verbalized understanding of instructions that were given at this phone interview. Patient denies shortness of breath, chest pain, fever, cough at this phone interview.

## 2021-07-10 ENCOUNTER — Telehealth: Payer: Self-pay

## 2021-07-10 NOTE — Telephone Encounter (Signed)
Telephone call to check on pre-operative status.  Patient compliant with pre-operative instructions.  Reinforced NPO after midnight.  No questions or concerns voiced.  Instructed to call for any needs.   

## 2021-07-11 ENCOUNTER — Other Ambulatory Visit: Payer: Self-pay

## 2021-07-11 ENCOUNTER — Ambulatory Visit (HOSPITAL_BASED_OUTPATIENT_CLINIC_OR_DEPARTMENT_OTHER): Payer: 59 | Admitting: Certified Registered Nurse Anesthetist

## 2021-07-11 ENCOUNTER — Ambulatory Visit (HOSPITAL_BASED_OUTPATIENT_CLINIC_OR_DEPARTMENT_OTHER)
Admission: RE | Admit: 2021-07-11 | Discharge: 2021-07-11 | Disposition: A | Discharge status: Home / Self Care | Payer: 59 | Attending: Gynecologic Oncology | Admitting: Gynecologic Oncology

## 2021-07-11 ENCOUNTER — Encounter (HOSPITAL_BASED_OUTPATIENT_CLINIC_OR_DEPARTMENT_OTHER): Payer: Self-pay | Admitting: Gynecologic Oncology

## 2021-07-11 ENCOUNTER — Encounter (HOSPITAL_BASED_OUTPATIENT_CLINIC_OR_DEPARTMENT_OTHER)
Admission: RE | Disposition: A | Discharge status: Home / Self Care | Payer: Self-pay | Source: Home / Self Care | Attending: Gynecologic Oncology

## 2021-07-11 DIAGNOSIS — Z87891 Personal history of nicotine dependence: Secondary | ICD-10-CM | POA: Insufficient documentation

## 2021-07-11 DIAGNOSIS — D071 Carcinoma in situ of vulva: Secondary | ICD-10-CM | POA: Diagnosis present

## 2021-07-11 DIAGNOSIS — Z881 Allergy status to other antibiotic agents status: Secondary | ICD-10-CM | POA: Insufficient documentation

## 2021-07-11 DIAGNOSIS — Z79899 Other long term (current) drug therapy: Secondary | ICD-10-CM | POA: Diagnosis not present

## 2021-07-11 DIAGNOSIS — Z880 Allergy status to penicillin: Secondary | ICD-10-CM | POA: Insufficient documentation

## 2021-07-11 DIAGNOSIS — Z888 Allergy status to other drugs, medicaments and biological substances status: Secondary | ICD-10-CM | POA: Insufficient documentation

## 2021-07-11 HISTORY — PX: VULVECTOMY: SHX1086

## 2021-07-11 LAB — POCT I-STAT, CHEM 8
BUN: 12 mg/dL (ref 6–20)
Calcium, Ion: 1.16 mmol/L (ref 1.15–1.40)
Chloride: 99 mmol/L (ref 98–111)
Creatinine, Ser: 0.8 mg/dL (ref 0.44–1.00)
Glucose, Bld: 92 mg/dL (ref 70–99)
HCT: 43 % (ref 36.0–46.0)
Hemoglobin: 14.6 g/dL (ref 12.0–15.0)
Potassium: 3.8 mmol/L (ref 3.5–5.1)
Sodium: 140 mmol/L (ref 135–145)
TCO2: 29 mmol/L (ref 22–32)

## 2021-07-11 SURGERY — VULVECTOMY
Anesthesia: General | Site: Vulva

## 2021-07-11 MED ORDER — LIDOCAINE 2% (20 MG/ML) 5 ML SYRINGE
INTRAMUSCULAR | Status: AC
  Filled 2021-07-11: qty 5

## 2021-07-11 MED ORDER — PROMETHAZINE HCL 25 MG/ML IJ SOLN: 6.2500 mg | INTRAMUSCULAR | Status: DC | PRN

## 2021-07-11 MED ORDER — DEXAMETHASONE SODIUM PHOSPHATE 10 MG/ML IJ SOLN
INTRAMUSCULAR | Status: AC
  Filled 2021-07-11: qty 1

## 2021-07-11 MED ORDER — PROPOFOL 10 MG/ML IV BOLUS
INTRAVENOUS | Status: DC | PRN
  Administered 2021-07-11: 160 mg via INTRAVENOUS

## 2021-07-11 MED ORDER — OXYCODONE HCL 5 MG/5ML PO SOLN: 5.0000 mg | Freq: Once | ORAL | Status: DC | PRN

## 2021-07-11 MED ORDER — DEXAMETHASONE SODIUM PHOSPHATE 10 MG/ML IJ SOLN: 4.0000 mg | INTRAMUSCULAR | Status: DC

## 2021-07-11 MED ORDER — PROPOFOL 10 MG/ML IV BOLUS
INTRAVENOUS | Status: AC
  Filled 2021-07-11: qty 20

## 2021-07-11 MED ORDER — 0.9 % SODIUM CHLORIDE (POUR BTL) OPTIME
TOPICAL | Status: DC | PRN
  Administered 2021-07-11: 1000 mL

## 2021-07-11 MED ORDER — GABAPENTIN 300 MG PO CAPS
300.0000 mg | ORAL_CAPSULE | ORAL | Status: AC
  Administered 2021-07-11: 300 mg via ORAL

## 2021-07-11 MED ORDER — LIDOCAINE HCL 1 % IJ SOLN
INTRAMUSCULAR | Status: DC | PRN
  Administered 2021-07-11: 20 mL

## 2021-07-11 MED ORDER — AMISULPRIDE (ANTIEMETIC) 5 MG/2ML IV SOLN: 10.0000 mg | Freq: Once | INTRAVENOUS | Status: DC | PRN

## 2021-07-11 MED ORDER — ACETAMINOPHEN 325 MG PO TABS: 325.0000 mg | ORAL_TABLET | ORAL | Status: DC | PRN

## 2021-07-11 MED ORDER — ACETIC ACID 5 % SOLN
Status: DC | PRN
  Administered 2021-07-11: 1 via TOPICAL

## 2021-07-11 MED ORDER — ACETAMINOPHEN 500 MG PO TABS
1000.0000 mg | ORAL_TABLET | ORAL | Status: AC
  Administered 2021-07-11: 1000 mg via ORAL

## 2021-07-11 MED ORDER — FENTANYL CITRATE (PF) 100 MCG/2ML IJ SOLN
INTRAMUSCULAR | Status: DC | PRN
  Administered 2021-07-11: 25 ug via INTRAVENOUS
  Administered 2021-07-11: 50 ug via INTRAVENOUS
  Administered 2021-07-11: 25 ug via INTRAVENOUS

## 2021-07-11 MED ORDER — SCOPOLAMINE 1 MG/3DAYS TD PT72
1.0000 | MEDICATED_PATCH | TRANSDERMAL | Status: DC
  Administered 2021-07-11: 1.5 mg via TRANSDERMAL

## 2021-07-11 MED ORDER — SCOPOLAMINE 1 MG/3DAYS TD PT72
MEDICATED_PATCH | TRANSDERMAL | Status: AC
  Filled 2021-07-11: qty 1

## 2021-07-11 MED ORDER — LACTATED RINGERS IV SOLN: INTRAVENOUS | Status: DC

## 2021-07-11 MED ORDER — ACETAMINOPHEN 10 MG/ML IV SOLN: 1000.0000 mg | Freq: Once | INTRAVENOUS | Status: DC | PRN

## 2021-07-11 MED ORDER — LIDOCAINE 2% (20 MG/ML) 5 ML SYRINGE
INTRAMUSCULAR | Status: DC | PRN
  Administered 2021-07-11: 80 mg via INTRAVENOUS

## 2021-07-11 MED ORDER — OXYCODONE HCL 5 MG PO TABS: 5.0000 mg | ORAL_TABLET | Freq: Once | ORAL | Status: DC | PRN

## 2021-07-11 MED ORDER — FENTANYL CITRATE (PF) 100 MCG/2ML IJ SOLN: 25.0000 ug | INTRAMUSCULAR | Status: DC | PRN

## 2021-07-11 MED ORDER — ONDANSETRON HCL 4 MG/2ML IJ SOLN
INTRAMUSCULAR | Status: AC
  Filled 2021-07-11: qty 2

## 2021-07-11 MED ORDER — ACETAMINOPHEN 500 MG PO TABS
ORAL_TABLET | ORAL | Status: AC
  Filled 2021-07-11: qty 2

## 2021-07-11 MED ORDER — DEXAMETHASONE SODIUM PHOSPHATE 10 MG/ML IJ SOLN
INTRAMUSCULAR | Status: DC | PRN
  Administered 2021-07-11: 10 mg via INTRAVENOUS

## 2021-07-11 MED ORDER — ONDANSETRON HCL 4 MG/2ML IJ SOLN
INTRAMUSCULAR | Status: DC | PRN
  Administered 2021-07-11: 4 mg via INTRAVENOUS

## 2021-07-11 MED ORDER — ACETAMINOPHEN 160 MG/5ML PO SOLN: 325.0000 mg | ORAL | Status: DC | PRN

## 2021-07-11 MED ORDER — CELECOXIB 200 MG PO CAPS
200.0000 mg | ORAL_CAPSULE | ORAL | Status: AC
  Administered 2021-07-11: 200 mg via ORAL

## 2021-07-11 MED ORDER — FENTANYL CITRATE (PF) 100 MCG/2ML IJ SOLN
INTRAMUSCULAR | Status: AC
  Filled 2021-07-11: qty 2

## 2021-07-11 MED ORDER — GABAPENTIN 300 MG PO CAPS
ORAL_CAPSULE | ORAL | Status: AC
  Filled 2021-07-11: qty 1

## 2021-07-11 MED ORDER — MIDAZOLAM HCL 2 MG/2ML IJ SOLN
INTRAMUSCULAR | Status: AC
  Filled 2021-07-11: qty 2

## 2021-07-11 MED ORDER — CELECOXIB 200 MG PO CAPS
ORAL_CAPSULE | ORAL | Status: AC
  Filled 2021-07-11: qty 1

## 2021-07-11 MED ORDER — MIDAZOLAM HCL 5 MG/5ML IJ SOLN
INTRAMUSCULAR | Status: DC | PRN
  Administered 2021-07-11: 2 mg via INTRAVENOUS

## 2021-07-11 SURGICAL SUPPLY — 40 items
BLADE CLIPPER SENSICLIP SURGIC (BLADE) IMPLANT
BLADE SURG 15 STRL LF DISP TIS (BLADE) ×2 IMPLANT
BLADE SURG 15 STRL SS (BLADE) ×3
CANISTER SUCT 1200ML W/VALVE (MISCELLANEOUS) IMPLANT
CATH ROBINSON RED A/P 16FR (CATHETERS) ×3 IMPLANT
DEPRESSOR TONGUE BLADE STERILE (MISCELLANEOUS) ×3 IMPLANT
DRSG TELFA 3X8 NADH (GAUZE/BANDAGES/DRESSINGS) IMPLANT
ELECT BALL LEEP 3MM BLK (ELECTRODE) IMPLANT
GAUZE 4X4 16PLY ~~LOC~~+RFID DBL (SPONGE) ×6 IMPLANT
GLOVE SURG ENC MOIS LTX SZ6 (GLOVE) ×6 IMPLANT
GOWN STRL REUS W/TWL LRG LVL3 (GOWN DISPOSABLE) ×3 IMPLANT
KIT TURNOVER CYSTO (KITS) ×3 IMPLANT
NDL HYPO 25X1 1.5 SAFETY (NEEDLE) ×1 IMPLANT
NEEDLE HYPO 25X1 1.5 SAFETY (NEEDLE) ×3 IMPLANT
NS IRRIG 500ML POUR BTL (IV SOLUTION) ×3 IMPLANT
PACK PERINEAL COLD (PAD) ×3 IMPLANT
PACK VAGINAL WOMENS (CUSTOM PROCEDURE TRAY) ×3 IMPLANT
PAD DRESSING TELFA 3X8 NADH (GAUZE/BANDAGES/DRESSINGS) IMPLANT
PAD PREP 24X48 CUFFED NSTRL (MISCELLANEOUS) ×3 IMPLANT
PUNCH BIOPSY DERMAL 3 (INSTRUMENTS) IMPLANT
PUNCH BIOPSY DERMAL 3MM (INSTRUMENTS)
PUNCH BIOPSY DERMAL 4MM (INSTRUMENTS) IMPLANT
SUT VIC AB 0 CT1 36 (SUTURE) IMPLANT
SUT VIC AB 0 SH 27 (SUTURE) ×3 IMPLANT
SUT VIC AB 2-0 CT2 27 (SUTURE) IMPLANT
SUT VIC AB 2-0 SH 27 (SUTURE) ×3
SUT VIC AB 2-0 SH 27XBRD (SUTURE) ×1 IMPLANT
SUT VIC AB 3-0 PS2 18 (SUTURE)
SUT VIC AB 3-0 PS2 18XBRD (SUTURE) IMPLANT
SUT VIC AB 3-0 SH 27 (SUTURE) ×3
SUT VIC AB 3-0 SH 27X BRD (SUTURE) ×2 IMPLANT
SUT VIC AB 4-0 PS2 18 (SUTURE) ×3 IMPLANT
SUT VICRYL 0 UR6 27IN ABS (SUTURE) IMPLANT
SUT VICRYL 4-0 PS2 18IN ABS (SUTURE) ×4 IMPLANT
SWAB OB GYN 8IN STERILE 2PK (MISCELLANEOUS) ×6 IMPLANT
SYR BULB IRRIG 60ML STRL (SYRINGE) ×3 IMPLANT
TOWEL OR 17X26 10 PK STRL BLUE (TOWEL DISPOSABLE) ×3 IMPLANT
TUBE CONNECTING 12X1/4 (SUCTIONS) IMPLANT
VACUUM HOSE 7/8X10 W/ WAND (MISCELLANEOUS) IMPLANT
WATER STERILE IRR 500ML POUR (IV SOLUTION) ×3 IMPLANT

## 2021-07-11 NOTE — Anesthesia Postprocedure Evaluation (Signed)
Anesthesia Post Note  Patient: JERIANNE ANSELMO  Procedure(s) Performed: WIDE LOCAL EXCISION OF THE VULVA (Vulva)     Patient location during evaluation: PACU Anesthesia Type: General Level of consciousness: awake and alert Pain management: pain level controlled Vital Signs Assessment: post-procedure vital signs reviewed and stable Respiratory status: spontaneous breathing, nonlabored ventilation, respiratory function stable and patient connected to nasal cannula oxygen Cardiovascular status: blood pressure returned to baseline and stable Postop Assessment: no apparent nausea or vomiting Anesthetic complications: no   No notable events documented.  Last Vitals:  Vitals:   07/11/21 1130 07/11/21 1159  BP: (!) 144/80 (!) 155/100  Pulse: 65 73  Resp: (!) 21 15  Temp: 36.4 C 36.5 C  SpO2: 96% 100%    Last Pain:  Vitals:   07/11/21 1159  TempSrc:   PainSc: 0-No pain                 Effie Berkshire

## 2021-07-11 NOTE — Anesthesia Procedure Notes (Signed)
Procedure Name: LMA Insertion Date/Time: 07/11/2021 10:21 AM Performed by: Genelle Bal, CRNA Pre-anesthesia Checklist: Patient identified, Emergency Drugs available, Suction available and Patient being monitored Patient Re-evaluated:Patient Re-evaluated prior to induction Oxygen Delivery Method: Circle system utilized Preoxygenation: Pre-oxygenation with 100% oxygen Induction Type: IV induction Ventilation: Mask ventilation without difficulty LMA: LMA inserted LMA Size: 4.0 Number of attempts: 1 Airway Equipment and Method: Bite block Placement Confirmation: positive ETCO2 Tube secured with: Tape Dental Injury: Teeth and Oropharynx as per pre-operative assessment

## 2021-07-11 NOTE — Discharge Instructions (Addendum)
No acetaminophen/Tylenol until after 3:00pm today if needed.   No ibuprofen, Advil, Aleve, Motrin, ketorolac, meloxicam, or naproxen until after 3:00pm today if needed.       07/11/2021   Activity: 1. Be up and out of the bed during the day.  Take a nap if needed.    2. No lifting or straining for 4-6 weeks.  3. No driving until off pain medications and you can brake safely, likely 2-5 days.  4. Shower daily.  Use soap and water on your incision and pat dry; don't rub. You can do Sitz baths 1-2 times a day. Pat incision dry or use hair dryer on cool.  5. No sexual activity and nothing in the vagina for 6 weeks.  Medications:  - Take ibuprofen and tylenol first line for pain control. Take these regularly (every 6 hours) to decrease the build up of pain.  - If necessary, for severe pain not relieved by ibuprofen, take tramadol.  - While taking tramadol you should take sennakot every night to reduce the likelihood of constipation. If this causes diarrhea, stop its use.  Diet: 1. Low sodium Heart Healthy Diet is recommended.  2. It is safe to use a laxative if you have difficulty moving your bowels.   Wound Care: 1. Keep clean and dry.  Shower daily.  Reasons to call the Doctor:  Fever - Oral temperature greater than 100.4 degrees Fahrenheit Foul-smelling vaginal discharge Difficulty urinating Nausea and vomiting Increased pain at the site of the incision that is unrelieved with pain medicine. Difficulty breathing with or without chest pain New calf pain especially if only on one side Sudden, continuing increased vaginal bleeding with or without clots.   Follow-up: 1. See Jeral Pinch in several weeks. Clinic will make you an appt.  Contacts: For questions or concerns you should contact:  Dr. Jeral Pinch at [phone] After hours and on week-ends call [phone] and ask to speak to the physician on call for Gynecologic Oncology        Post  Anesthesia Home Care Instructions  Activity: Get plenty of rest for the remainder of the day. A responsible individual must stay with you for 24 hours following the procedure.  For the next 24 hours, DO NOT: -Drive a car -Paediatric nurse -Drink alcoholic beverages -Take any medication unless instructed by your physician -Make any legal decisions or sign important papers.  Meals: Start with liquid foods such as gelatin or soup. Progress to regular foods as tolerated. Avoid greasy, spicy, heavy foods. If nausea and/or vomiting occur, drink only clear liquids until the nausea and/or vomiting subsides. Call your physician if vomiting continues.  Special Instructions/Symptoms: Your throat may feel dry or sore from the anesthesia or the breathing tube placed in your throat during surgery. If this causes discomfort, gargle with warm salt water. The discomfort should disappear within 24 hours.  If you had a scopolamine patch placed behind your ear for the management of post- operative nausea and/or vomiting:  1. The medication in the patch is effective for 72 hours, after which it should be removed.  Wrap patch in a tissue and discard in the trash. Wash hands thoroughly with soap and water. 2. You may remove the patch earlier than 72 hours if you experience unpleasant side effects which may include dry mouth, dizziness or visual disturbances. 3. Avoid touching the patch. Wash your hands with soap and water after contact with the patch.

## 2021-07-11 NOTE — Op Note (Signed)
PATIENT: [name] DATE: 07/11/21  Preop Diagnosis: High grade vulvar dysplasia  Postoperative Diagnosis: same as above  Surgery: Partial simple right and left vulvectomies  Surgeons:  Valarie Cones MD  Assistant: none  Anesthesia: General   Estimated blood loss: 50 ml  IVF:  see I&O flowsheet   Urine output: n/a   Complications: None apparent  Pathology: Right and left vulva with marking stitch at 12 o'clock  Operative findings: 2 x 1 cm mildly erythematous with hyperkeratotic lesion on left posterior vulva between 4 and 5 o'c. Second <1 x 1 cm similar appearing lesion on right vulva at 8 o'c. No other acetowhite or hyperkeratotic changes of the vulvar skin after application of acetic acid.  Procedure: The patient was identified in the preoperative holding area. Informed consent was signed on the chart. Patient was seen history was reviewed and exam was performed.   The patient was then taken to the operating room and placed in the supine position with SCD hose on. General anesthesia was then induced without difficulty. She was then placed in the dorsolithotomy position. The perineum was prepped with Betadine. The vagina was prepped with Betadine. The patient was then draped after the prep was dried.   Timeout was performed the patient, procedure, antibiotic, allergy, and length of procedure. 5% acetic acid solution was applied to the perineum. The vulvar tissues were inspected for areas of acetowhite changes or leukoplakia. The two lesions were identified and the marking pen was used to circumscribe the area with appropriate surgical margins. The subcuticular tissues were infiltrated with 1% lidocaine. The 15 blade scalpel was used to make an incision through the skin circumferentially as marked. The skin elipse was grasped and was separated from the underlying deep dermal tissues with the bovie device. After the specimen had been completely resected, it was oriented and  marked at 12 o'clock with a 0-vicryl suture. The was performed for both lesions.The bovie was used to obtain hemostasis at the surgical bed. The subcutaneous tissues were irrigated and made hemostatic.   The deep dermal layer was approximated with 2-0vicryl mattress sutures to bring the skin edges into approximation and off tension. The wound was closed following langher's lines. The superficial subcutaneous layer was closed with running 3-0 Vicryl. The cutaneous layer was closed with interrupted 4-0 vicryl stitches in mattress fashion to ensure a tension free and hemostatic closure. The perineum was again irrigated.   All instrument, suture, laparotomy, Ray-Tec, and needle counts were correct x2. The patient tolerated the procedure well and was taken recovery room in stable condition.  Jeral Pinch MD Gynecologic Oncology

## 2021-07-11 NOTE — Transfer of Care (Signed)
Immediate Anesthesia Transfer of Care Note  Patient: JANYIAH SILVERI  Procedure(s) Performed: WIDE LOCAL EXCISION OF THE VULVA (Vulva)  Patient Location: PACU  Anesthesia Type:General  Level of Consciousness: awake, alert  and oriented  Airway & Oxygen Therapy: Patient Spontanous Breathing and Patient connected to face mask oxygen  Post-op Assessment: Report given to RN and Post -op Vital signs reviewed and stable  Post vital signs: Reviewed and stable  Last Vitals:  Vitals Value Taken Time  BP 132/78   Temp    Pulse 66 07/11/21 1110  Resp 17 07/11/21 1110  SpO2 100 % 07/11/21 1110  Vitals shown include unvalidated device data.  Last Pain:  Vitals:   07/11/21 0853  TempSrc: Oral         Complications: No notable events documented.

## 2021-07-11 NOTE — Anesthesia Preprocedure Evaluation (Addendum)
Anesthesia Evaluation  Patient identified by MRN, date of birth, ID band Patient awake    Reviewed: Allergy & Precautions, NPO status , Patient's Chart, lab work & pertinent test results  Airway Mallampati: I  TM Distance: >3 FB Neck ROM: Full    Dental  (+) Teeth Intact, Dental Advisory Given   Pulmonary former smoker,    breath sounds clear to auscultation       Cardiovascular hypertension,  Rhythm:Regular Rate:Normal     Neuro/Psych negative neurological ROS  negative psych ROS   GI/Hepatic negative GI ROS, Neg liver ROS,   Endo/Other  negative endocrine ROS  Renal/GU negative Renal ROS     Musculoskeletal negative musculoskeletal ROS (+)   Abdominal Normal abdominal exam  (+)   Peds  Hematology negative hematology ROS (+)   Anesthesia Other Findings   Reproductive/Obstetrics                            Anesthesia Physical Anesthesia Plan  ASA: 2  Anesthesia Plan: General   Post-op Pain Management:    Induction: Intravenous  PONV Risk Score and Plan: 4 or greater and Ondansetron, Dexamethasone, Midazolam and Scopolamine patch - Pre-op  Airway Management Planned: LMA  Additional Equipment: None  Intra-op Plan:   Post-operative Plan: Extubation in OR  Informed Consent: I have reviewed the patients History and Physical, chart, labs and discussed the procedure including the risks, benefits and alternatives for the proposed anesthesia with the patient or authorized representative who has indicated his/her understanding and acceptance.     Dental advisory given  Plan Discussed with: CRNA  Anesthesia Plan Comments:        Anesthesia Quick Evaluation

## 2021-07-11 NOTE — Interval H&P Note (Signed)
History and Physical Interval Note:  07/11/2021 9:57 AM  [name]  has presented today for surgery, with the diagnosis of VULVAR DYSPLASIA.  The various methods of treatment have been discussed with the patient and family. After consideration of risks, benefits and other options for treatment, the patient has consented to  Procedure(s): VULVECTOMY (N/A) POSSIBLE CO2 LASER APPLICATION OF THE VULVA (N/A) as a surgical intervention.  The patient's history has been reviewed, patient examined, no change in status, stable for surgery.  I have reviewed the patient's chart and labs.  Questions were answered to the patient's satisfaction.     [name]

## 2021-07-12 ENCOUNTER — Telehealth: Payer: Self-pay

## 2021-07-12 ENCOUNTER — Encounter (HOSPITAL_BASED_OUTPATIENT_CLINIC_OR_DEPARTMENT_OTHER): Payer: Self-pay | Admitting: Gynecologic Oncology

## 2021-07-12 NOTE — Telephone Encounter (Signed)
Spoke with Ms. [name] this morning. She states she is eating, drinking and urinating well. She has not had a BM yet but is passing gas. She is taking senokot as prescribed and encouraged her to drink plenty of water. She denies fever or chills. She rates her pain as 2/10. Her pain is controlled with tylenol and advil. She has been using ice packs wrapped in washcloths applied to vulva every hour.  Instructed to call office with any fever, chills, purulent drainage, uncontrolled pain or any other questions or concerns. Patient verbalizes understanding.   Pt aware of post op appointments as well as the office number [phone] and after hours number [phone] to call if she has any questions or concerns

## 2021-07-14 LAB — SURGICAL PATHOLOGY

## 2021-08-04 ENCOUNTER — Encounter: Payer: Self-pay | Admitting: Gynecologic Oncology

## 2021-08-07 ENCOUNTER — Inpatient Hospital Stay: Payer: 59 | Attending: Gynecologic Oncology | Admitting: Gynecologic Oncology

## 2021-08-07 ENCOUNTER — Encounter: Payer: Self-pay | Admitting: Gynecologic Oncology

## 2021-08-07 ENCOUNTER — Other Ambulatory Visit: Payer: Self-pay

## 2021-08-07 VITALS — BP 157/85 | HR 71 | Temp 98.4°F | Resp 16 | Ht 66.0 in | Wt 169.0 lb

## 2021-08-07 DIAGNOSIS — Z9079 Acquired absence of other genital organ(s): Secondary | ICD-10-CM

## 2021-08-07 DIAGNOSIS — D071 Carcinoma in situ of vulva: Secondary | ICD-10-CM

## 2021-08-07 NOTE — Patient Instructions (Addendum)
You are healing great from surgery!  I will see you back in 1 more month to check on the incision and opened.  After that, I am releasing you back to your gynecologist for future follow-up. Given the risk of recurrence of vulvar precancer that we discussed today, I recommend that you have clinic visits every 6 months initially.

## 2021-08-07 NOTE — Progress Notes (Signed)
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   Interval History: Patient presents today for follow-up after surgery.  She notes overall doing well.  She had pain related to surgery for the first couple of weeks, more on the left than right side.  The right side ultimately opened partially.  She denies any bleeding or discharge.  She endorses regular bowel and bladder function.  She now finally is not having pain when she has a bowel movement.  Past Medical/Surgical History: Past Medical History:  Diagnosis Date   Finger mass, left 06/04/2019   Hypertension    Macrocytosis without anemia 2019   Osteoporosis    Vulvar dysplasia     Past Surgical History:  Procedure Laterality Date   CATARACT EXTRACTION, BILATERAL     CESAREAN SECTION  1610   CO2 LASER APPLICATION Left 9604   Vulvar   FOOT GANGLION EXCISION Left 1998   x2   TUBAL LIGATION Bilateral 1998   VULVECTOMY N/A 07/11/2021   Procedure: WIDE LOCAL EXCISION OF THE VULVA;  Surgeon: [name], MD;  Location: [place];  Service: Gynecology;  Laterality: N/A;    Family History  Problem Relation Age of Onset   Colon cancer Mother    Prostate cancer Father    Thyroid disease Sister    Thyroid disease Brother    Breast cancer Maternal Grandmother    Healthy Son    Pancreatic cancer Neg Hx    Ovarian cancer Neg Hx    Endometrial cancer Neg Hx     Social History   Socioeconomic History   Marital status: Married    Spouse name: Not on file   Number of children: Not on file   Years of education: Not on file   Highest education level: Not on file  Occupational History   Not on file  Tobacco Use   Smoking status: Former    Packs/day: 0.25    Years: 25.00    Pack years: 6.25     Types: Cigarettes    Quit date: 2012    Years since quitting: 10.8   Smokeless tobacco: Never  Vaping Use   Vaping Use: Never used  Substance and Sexual Activity   Alcohol use: Yes    Comment: daily - 1-3 glasses of wine   Drug use: Never   Sexual activity: Not Currently    Birth control/protection: Surgical  Other Topics Concern   Not on file  Social History Narrative   Not on file   Social Determinants of Health   Financial Resource Strain: Not on file  Food Insecurity: Not on file  Transportation Needs: Not on file  Physical Activity: Not on file  Stress: Not on file  Social Connections: Not on file    Current Medications:  Current Outpatient Medications:    ascorbic acid (VITAMIN C) 1000 MG tablet, Take 1,000 mg by mouth daily., Disp: , Rfl:    cycloSPORINE (RESTASIS) 0.05 % ophthalmic emulsion, Place 1 drop into both eyes in the morning and at bedtime., Disp: , Rfl:    EPINEPHrine 0.3 mg/0.3 mL IJ SOAJ injection, Inject into the muscle. Prn, Disp: , Rfl:    Polyethyl Glycol-Propyl Glycol (SYSTANE) 0.4-0.3 % SOLN, Apply 1 drop to eye 4 (four) times daily., Disp: , Rfl:    triamterene-hydrochlorothiazide (DYAZIDE) 37.5-25  MG capsule, Take 1 capsule by mouth daily., Disp: , Rfl:    ibuprofen (ADVIL) 800 MG tablet, Take 1 tablet (800 mg total) by mouth every 8 (eight) hours as needed for moderate pain. For AFTER surgery only, Disp: 30 tablet, Rfl: 1   senna-docusate (SENOKOT-S) 8.6-50 MG tablet, Take 2 tablets by mouth at bedtime. For AFTER surgery, do not take if having diarrhea, Disp: 30 tablet, Rfl: 0   traMADol (ULTRAM) 50 MG tablet, Take 1 tablet (50 mg total) by mouth every 6 (six) hours as needed for severe pain. For AFTER surgery, do not take and drive, Disp: 10 tablet, Rfl: 0  Review of Systems: Denies appetite changes, fevers, chills, fatigue, unexplained weight changes. Denies hearing loss, neck lumps or masses, mouth sores, ringing in ears or voice  changes. Denies cough or wheezing.  Denies shortness of breath. Denies chest pain or palpitations. Denies leg swelling. Denies abdominal distention, pain, blood in stools, constipation, diarrhea, nausea, vomiting, or early satiety. Denies pain with intercourse, dysuria, frequency, hematuria or incontinence. Denies hot flashes, pelvic pain, vaginal bleeding or vaginal discharge.   Denies joint pain, back pain or muscle pain/cramps. Denies itching, rash, or wounds. Denies dizziness, headaches, numbness or seizures. Denies swollen lymph nodes or glands, denies easy bruising or bleeding. Denies anxiety, depression, confusion, or decreased concentration.  Physical Exam: BP (!) 157/85 (BP Location: Left Arm, Patient Position: Sitting)   Pulse 71   Temp 98.4 F (36.9 C) (Oral)   Resp 16   Ht 5\' 6"  (1.676 m)   Wt 169 lb (76.7 kg)   SpO2 100%   BMI 27.28 kg/m  General: Alert, oriented, no acute distress. HEENT: Normocephalic, atraumatic, sclera anicteric. Chest: Unlabored breathing on room air. Extremities: Grossly normal range of motion.  Warm, well perfused.  No edema bilaterally. Skin: No rashes or lesions noted. GU: External female genitalia notable for completely healed left vulvar excision.  Right vulvar incision has open minimally along about 3 cm of the incision.  There is still some suture visible.  No erythema, induration, or exudate.  Laboratory & Radiologic Studies: A. 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  - All margins negative (0.4 cm at closest approach to resection margin).   B. 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  - All margins negative (0.6 cm at closest approach to resection margin).   Assessment & Plan: [name] is a 60 y.o. 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  Patient is overall doing very well from surgery and meeting postoperative milestones.  One of her incisions opened but is overall healing well with no evidence of infection.  I will plan to see her back in 4 weeks to assure continued healing.  Given risk of recurrence with vulvar dysplasia, we discussed need for continued close surveillance.  I recommend visits with gynecologist with an exam every 6 months for 5 years and then annually.  28 minutes of total time was spent for this patient encounter, including preparation, face-to-face counseling with the patient and coordination of care, and documentation of the encounter.  Jeral Pinch, MD  Division of Gynecologic Oncology  Department of Obstetrics and Gynecology  [facility]

## 2021-08-14 IMAGING — MR MR [PERSON_NAME]*[PERSON_NAME]* WO/W CM
11 series · 40 of 40 positions shown · IV contrast (multihance)
Comparison: X-ray 03/03/2019 ultrasound 04/29/2019

CLINICAL DATA: Soft tissue mass at the volar ulnar aspect of the
left small finger

EXAM:
MRI OF THE LEFT FINGERS WITHOUT AND WITH CONTRAST
TECHNIQUE: Multiplanar, multisequence MR imaging of the left was performed
before and after the administration of intravenous contrast.
CONTRAST:  14mL MULTIHANCE GADOBENATE DIMEGLUMINE 529 MG/ML IV SOLN

[Series 3: t1_ax · axial · left · 3.0mm · 0.31mm/px · z∈[-103,+36]mm · 6 of 40 slices shown]
[im 1/40]
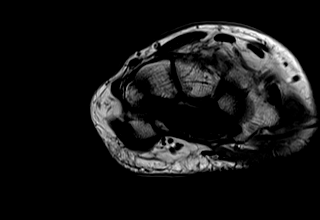
[im 8/40]
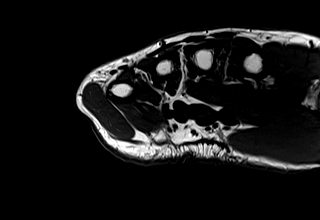
[im 16/40]
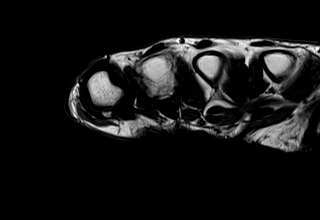
[im 24/40]
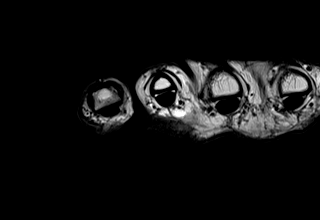
[im 32/40]
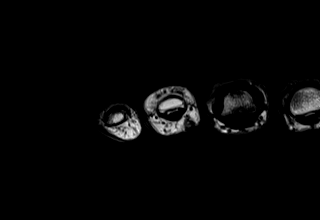
[im 40/40]
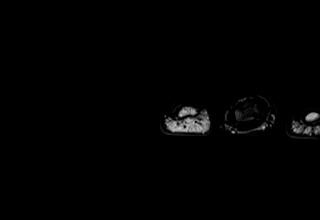

[Series 4: T2 fat-sat · axial · left · 3.0mm · 0.31mm/px · z∈[-104,+36]mm · 6 of 40 slices shown (1 of 2)]
[im 1/40]
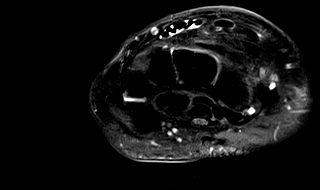
[im 8/40]
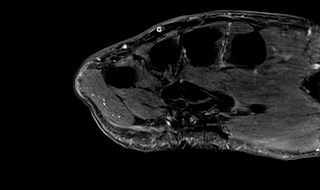
[im 16/40]
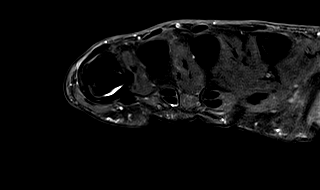
[im 24/40]
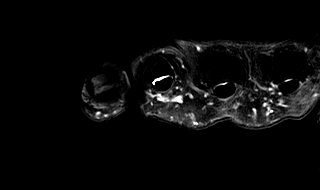
[im 32/40]
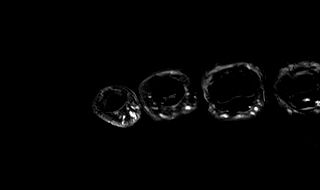
[im 40/40]
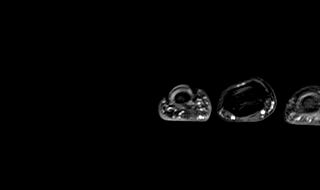

[Series 5: T2 fat-sat · coronal · left · 2.0mm · 0.22mm/px · 2 of 15 slices shown (2 of 2)]
[im 1/15]
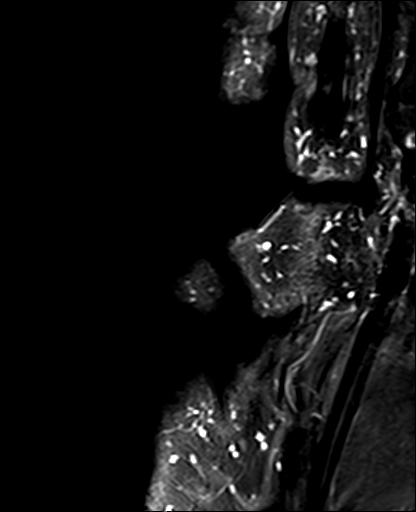
[im 15/15]
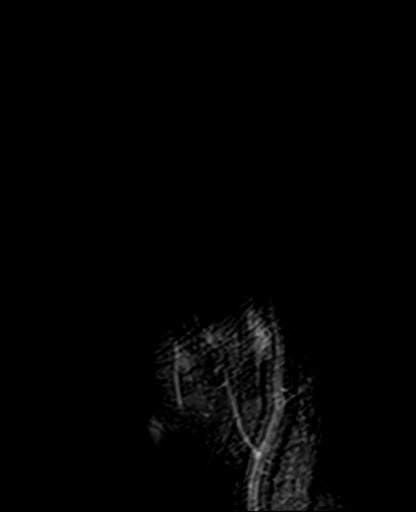

[Series 6: T1 · coronal · left · 2.0mm · 0.49mm/px · 2 of 15 slices shown]
[im 1/15]
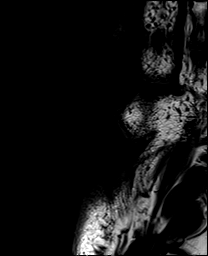
[im 15/15]
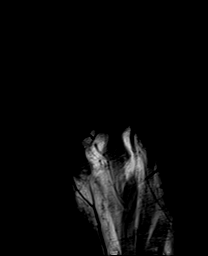

[Series 7: PD fat-sat · sagittal · left · 3.0mm · 0.31mm/px · 2 of 14 slices shown]
[im 1/14]
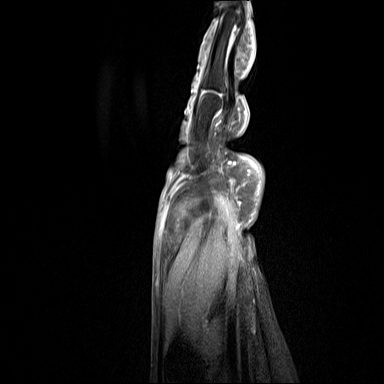
[im 14/14]
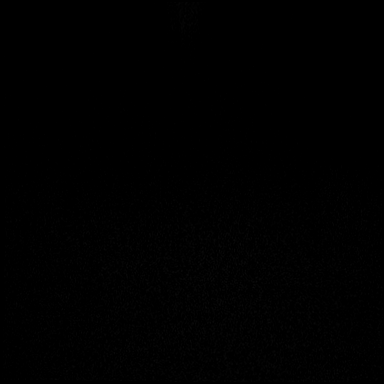

[Series 8: t1_ax fs pre · axial · non-contrast · left · 3.0mm · 0.31mm/px · z∈[-103,+36]mm · 6 of 40 slices shown]
[im 1/40]
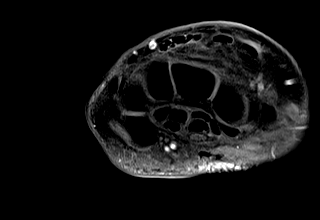
[im 8/40]
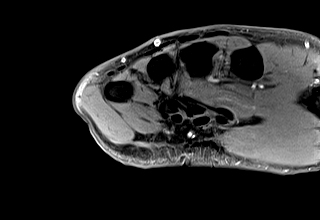
[im 16/40]
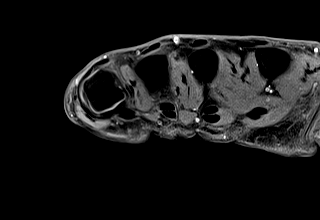
[im 24/40]
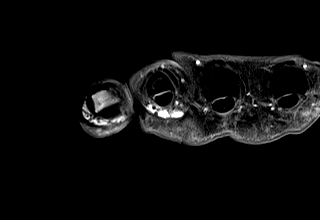
[im 32/40]
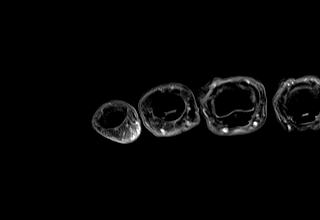
[im 40/40]
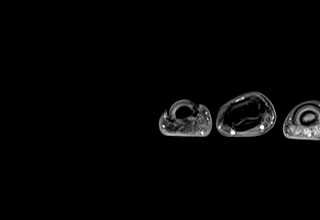

[Series 9: t1_ax fs post · axial · left · 3.0mm · 0.31mm/px · z∈[-103,+36]mm · 6 of 40 slices shown]
[im 1/40]
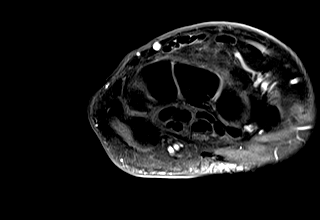
[im 8/40]
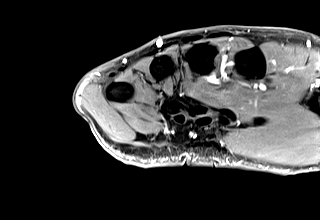
[im 16/40]
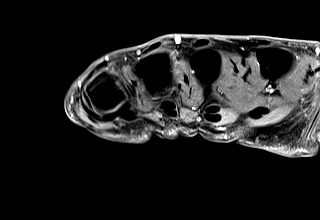
[im 24/40]
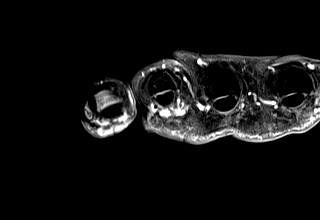
[im 32/40]
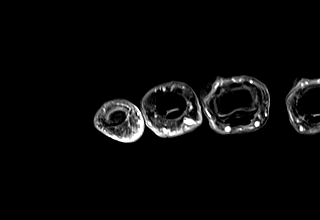
[im 40/40]
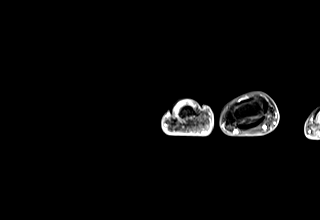

[Series 10: T1 fat-sat post-contrast · coronal · left · 2.0mm · 0.49mm/px · 2 of 15 slices shown (1 of 4)]
[im 1/15]
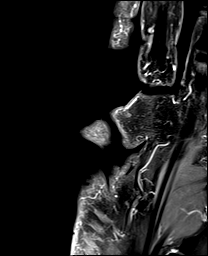
[im 15/15]
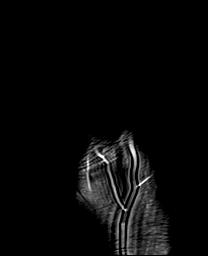

[Series 11: T1 fat-sat post-contrast · sagittal · left · 3.0mm · 0.47mm/px · 2 of 14 slices shown (2 of 4)]
[im 1/14]
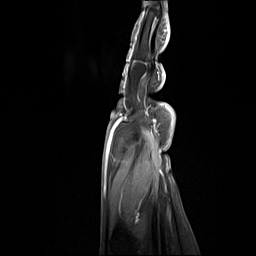
[im 14/14]
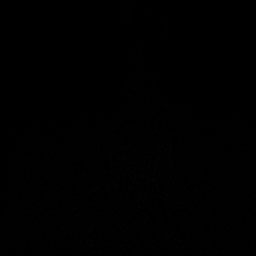

[Series 12: T1 fat-sat post-contrast · coronal · left · 1.0mm · 0.49mm/px · 4 of 26 slices shown (3 of 4)]
[im 1/26]
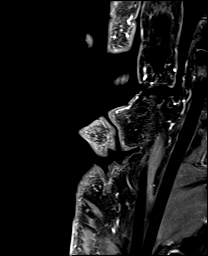
[im 9/26]
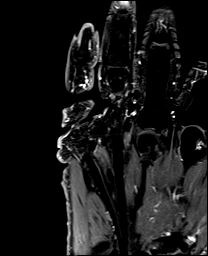
[im 17/26]
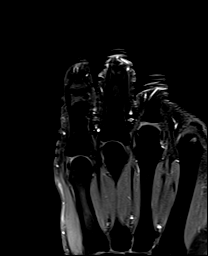
[im 26/26]
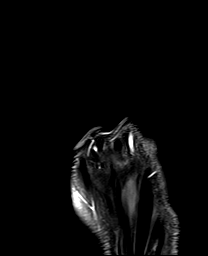

[Series 13: T1 fat-sat post-contrast · sagittal · left · 1.5mm · 0.47mm/px · 2 of 16 slices shown (4 of 4)]
[im 1/16]
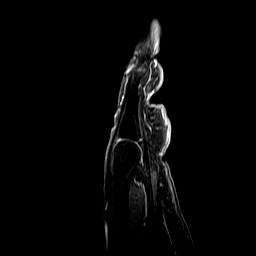
[im 16/16]
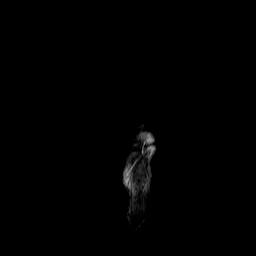

[40 of 40 positions shown; findings below may reference images not displayed]

FINDINGS: Bones/Joint/Cartilage

No acute fracture. No dislocation. Reactive subchondral marrow
changes at the small finger PIP joint without well-defined cartilage
defect. Subchondral cyst versus intraosseous ganglion within the
radial aspects of the second and third metacarpal heads.

Ligaments

Intact.

Muscles and Tendons

Normal muscle bulk and signal. Flexor and extensor tendons are
intact and normal in appearance without tenosynovitis.

Soft tissues

Within the volar soft tissues along the ulnar aspect of the left
small finger proximal phalanx is a ovoid 3 x 3 mm T1/T2 hypointense
nonenhancing focus (series 3, image 18). This lesion appears
separate from the adjacent flexor tendon. No surrounding edema. No
additional lesions. No fluid collections.
IMPRESSION: Low signal, nonenhancing 3 mm focus within the volar-ulnar soft
tissues of the left small finger proximal phalanx. Findings are
nonspecific. Differential includes benign entities such as fibroma
or a focus of nodular fasciitis. No concerning features by MRI.

## 2021-09-03 NOTE — Progress Notes (Signed)
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   Interval History: Patient reports doing well today.  She denies any new symptoms since her last visit.  She denies any vaginal bleeding, discharge, or pruritus.  She has some very mild irritation intermittently when she urinates.  Feels that her vulva is "stretched" and that her vaginal canal is narrowed.  Past Medical/Surgical History: Past Medical History:  Diagnosis Date   Finger mass, left 06/04/2019   Hypertension    Macrocytosis without anemia 2019   Osteoporosis    Vulvar dysplasia     Past Surgical History:  Procedure Laterality Date   CATARACT EXTRACTION, BILATERAL     CESAREAN SECTION  7681   CO2 LASER APPLICATION Left 1572   Vulvar   FOOT GANGLION EXCISION Left 1998   x2   TUBAL LIGATION Bilateral 1998   VULVECTOMY N/A 07/11/2021   Procedure: WIDE LOCAL EXCISION OF THE VULVA;  Surgeon: [name], MD;  Location: [place];  Service: Gynecology;  Laterality: N/A;    Family History  Problem Relation Age of Onset   Colon cancer Mother    Prostate cancer Father    Thyroid disease Sister    Thyroid disease Brother    Breast cancer Maternal Grandmother    Healthy Son    Pancreatic cancer Neg Hx    Ovarian cancer Neg Hx    Endometrial cancer Neg Hx     Social History   Socioeconomic History   Marital status: Married    Spouse name: Not on file   Number of children: Not on file   Years of education: Not on file   Highest education level: Not on file  Occupational History   Not on file  Tobacco Use   Smoking status: Former    Packs/day: 0.25    Years: 25.00    Pack years: 6.25    Types: Cigarettes    Quit date: 2012    Years since quitting: 10.9   Smokeless  tobacco: Never  Vaping Use   Vaping Use: Never used  Substance and Sexual Activity   Alcohol use: Yes    Comment: daily - 1-3 glasses of wine   Drug use: Never   Sexual activity: Not Currently    Birth control/protection: Surgical  Other Topics Concern   Not on file  Social History Narrative   Not on file   Social Determinants of Health   Financial Resource Strain: Not on file  Food Insecurity: Not on file  Transportation Needs: Not on file  Physical Activity: Not on file  Stress: Not on file  Social Connections: Not on file    Current Medications:  Current Outpatient Medications:    ascorbic acid (VITAMIN C) 1000 MG tablet, Take 1,000 mg by mouth daily., Disp: , Rfl:    EPINEPHrine 0.3 mg/0.3 mL IJ SOAJ injection, Inject into the muscle. Prn, Disp: , Rfl:    Polyethyl Glycol-Propyl Glycol (SYSTANE) 0.4-0.3 % SOLN, Apply 1 drop to eye 4 (four) times daily., Disp: , Rfl:    triamterene-hydrochlorothiazide (DYAZIDE) 37.5-25 MG capsule, Take 1 capsule by mouth daily., Disp: , Rfl:    cycloSPORINE (RESTASIS) 0.05 % ophthalmic emulsion, Place 1 drop into both eyes in the morning and at bedtime. (Patient not taking: Reported on 09/04/2021), Disp: , Rfl:  Review of Systems: Denies appetite changes, fevers, chills, fatigue, unexplained weight changes. Denies hearing loss, neck lumps or masses, mouth sores, ringing in ears or voice changes. Denies cough or wheezing.  Denies shortness of breath. Denies chest pain or palpitations. Denies leg swelling. Denies abdominal distention, pain, blood in stools, constipation, diarrhea, nausea, vomiting, or early satiety. Denies pain with intercourse, dysuria, frequency, hematuria or incontinence. Denies hot flashes, pelvic pain, vaginal bleeding or vaginal discharge.   Denies joint pain, back pain or muscle pain/cramps. Denies itching, rash, or wounds. Denies dizziness, headaches, numbness or seizures. Denies swollen lymph nodes or glands,  denies easy bruising or bleeding. Denies anxiety, depression, confusion, or decreased concentration.  Physical Exam: BP (!) 162/93 (BP Location: Left Arm, Patient Position: Sitting)   Pulse 90   Temp (!) 97.1 F (36.2 C) (Tympanic)   Resp 16   Ht 5\' 1"  (1.549 m)   Wt 168 lb (76.2 kg)   SpO2 100%   BMI 31.74 kg/m  General: Alert, oriented, no acute distress. HEENT: Posterior oropharynx clear, sclera anicteric. Chest: Unlabored breathing on room air. Extremities: Grossly normal range of motion.  Warm, well perfused.  No edema bilaterally. GU: Normal appearing external genitalia without erythema, excoriation, or lesions.  Vulvar incisions have healed very well.  Left side has no visible healing tissue.  Right side has healed closed however there is still an area where there is a minor indentation in the tissue, suspect still healing.  No erythema or hyperkeratosis noted.  No lesions.  Bimanual exam performed in 2 digits easily fit within the vaginal vault.  Laboratory & Radiologic Studies: None new  Assessment & Plan: [name] is a 60 y.o. 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  Patient continues to do well and has healed from surgery.  We discussed that she is released back to all activity.  She has some feeling of limited mobility of the vulvar tissue and vaginal tissue after surgery.  Exam performed today to assure that vagina caliber is still the same, which it is.  Patient was reassured by this.   Given risk of recurrence with vulvar dysplasia, we had previously discussed need for continued close surveillance.  I recommend visits with gynecologist with an exam every 6 months for 5 years and then annually.  She called after her last visit to get a visit scheduled for May with Dr. Julien Girt.  She was asked by the office staff to call back in December secondary to the schedule not being out  yet.  22 minutes of total time was spent for this patient encounter, including preparation, face-to-face counseling with the patient and coordination of care, and documentation of the encounter.  Jeral Pinch, MD  Division of Gynecologic Oncology  Department of Obstetrics and Gynecology  [facility]

## 2021-09-04 ENCOUNTER — Encounter: Payer: Self-pay | Admitting: Gynecologic Oncology

## 2021-09-04 ENCOUNTER — Other Ambulatory Visit: Payer: Self-pay

## 2021-09-04 ENCOUNTER — Inpatient Hospital Stay: Payer: 59 | Attending: Gynecologic Oncology | Admitting: Gynecologic Oncology

## 2021-09-04 VITALS — BP 162/93 | HR 90 | Temp 97.1°F | Resp 16 | Ht 61.0 in | Wt 168.0 lb

## 2021-09-04 DIAGNOSIS — D071 Carcinoma in situ of vulva: Secondary | ICD-10-CM

## 2021-09-04 DIAGNOSIS — Z9079 Acquired absence of other genital organ(s): Secondary | ICD-10-CM

## 2021-09-04 NOTE — Patient Instructions (Signed)
It was good to see you!  Things look like they have healed well.  Please let me know if you have any problems in the future or develop any new symptoms.  I will send my note again today to Dr. Julien Girt.

## 2021-09-11 ENCOUNTER — Ambulatory Visit: Payer: 59 | Admitting: Gynecologic Oncology

## 2021-12-06 ENCOUNTER — Emergency Department (HOSPITAL_BASED_OUTPATIENT_CLINIC_OR_DEPARTMENT_OTHER)
Admission: EM | Admit: 2021-12-06 | Discharge: 2021-12-06 | Disposition: A | Discharge status: Home / Self Care | Payer: 59 | Attending: Emergency Medicine | Admitting: Emergency Medicine

## 2021-12-06 ENCOUNTER — Encounter (HOSPITAL_BASED_OUTPATIENT_CLINIC_OR_DEPARTMENT_OTHER): Payer: Self-pay

## 2021-12-06 ENCOUNTER — Other Ambulatory Visit: Payer: Self-pay

## 2021-12-06 DIAGNOSIS — W19XXXA Unspecified fall, initial encounter: Secondary | ICD-10-CM

## 2021-12-06 DIAGNOSIS — S0993XA Unspecified injury of face, initial encounter: Secondary | ICD-10-CM | POA: Diagnosis present

## 2021-12-06 DIAGNOSIS — Z79899 Other long term (current) drug therapy: Secondary | ICD-10-CM | POA: Diagnosis not present

## 2021-12-06 DIAGNOSIS — W101XXA Fall (on)(from) sidewalk curb, initial encounter: Secondary | ICD-10-CM | POA: Insufficient documentation

## 2021-12-06 DIAGNOSIS — Y92828 Other wilderness area as the place of occurrence of the external cause: Secondary | ICD-10-CM | POA: Diagnosis not present

## 2021-12-06 DIAGNOSIS — S0031XA Abrasion of nose, initial encounter: Secondary | ICD-10-CM | POA: Insufficient documentation

## 2021-12-06 DIAGNOSIS — S0011XA Contusion of right eyelid and periocular area, initial encounter: Secondary | ICD-10-CM | POA: Diagnosis not present

## 2021-12-06 DIAGNOSIS — S80211A Abrasion, right knee, initial encounter: Secondary | ICD-10-CM | POA: Diagnosis not present

## 2021-12-06 DIAGNOSIS — S80212A Abrasion, left knee, initial encounter: Secondary | ICD-10-CM | POA: Insufficient documentation

## 2021-12-06 NOTE — ED Triage Notes (Addendum)
Pt states she tripped on curb/fell ~45 min PTA-pain/abrasions to face/bilat knees-pain across forehead no LOC-denies neck pain-NAD-to triage in w/c ?

## 2021-12-06 NOTE — ED Provider Notes (Signed)
?[facility]PART[facility] ?Provider Note ? ? ?CSN: [id number] ?Arrival date & time: 12/06/21  1138 ? ?  ? ?History ? ?Chief Complaint  ?Patient presents with  ? Fall  ? ? ?[name] is a 61 y.o. female who presents to the emergency department after a fall about 45 minutes prior to ER arrival.  Patient states that she was on her morning walk, when she tripped on the curb and "face planted" on the ground.  She is complaining of abrasions to her face and her bilateral knees.  There was no loss of consciousness.  She retains full memory of the event.  Denies nausea or vomiting.  She was able to get up and walk home without assistance.  She states she initially felt a little stunned, but that has all resolved.  She presents today wanting to rule out a concussion. ? ?Fall ?Associated symptoms include headaches.  ? ?  ? ?Home Medications ?Prior to Admission medications   ?Medication Sig Start Date End Date Taking? Authorizing Provider  ?ascorbic acid (VITAMIN C) 1000 MG tablet Take 1,000 mg by mouth daily.    [provider]  ?cycloSPORINE (RESTASIS) 0.05 % ophthalmic emulsion Place 1 drop into both eyes in the morning and at bedtime. ?Patient not taking: Reported on 09/04/2021    [provider]  ?EPINEPHrine 0.3 mg/0.3 mL IJ SOAJ injection Inject into the muscle. Prn 02/02/20   [provider]  ?Polyethyl Glycol-Propyl Glycol (SYSTANE) 0.4-0.3 % SOLN Apply 1 drop to eye 4 (four) times daily.    [provider]  ?triamterene-hydrochlorothiazide (DYAZIDE) 37.5-25 MG capsule Take 1 capsule by mouth daily. 06/20/21   [provider]  ?   ? ?Allergies    ?Besifloxacin hcl, Covid-19 mrna vac 5-11y pfizer [covid-19 mrna vacc (moderna)], Levofloxacin, and Penicillin g   ? ?Review of Systems   ?Review of Systems  ?Musculoskeletal:  Negative for back pain, gait problem and neck pain.  ?     Bilateral knee pain  ?Neurological:  Positive for headaches. Negative for  dizziness, tremors, seizures, syncope, weakness, light-headedness and numbness.  ?All other systems reviewed and are negative. ? ?Physical Exam ?Updated Vital Signs ?BP (!) 177/96 (BP Location: Right Arm)   Pulse 84   Temp 98.1 ?F (36.7 ?C) (Oral)   Resp 18   SpO2 98%  ?Physical Exam ?Vitals and nursing note reviewed.  ?Constitutional:   ?   Appearance: Normal appearance.  ?HENT:  ?   Head: Normocephalic and atraumatic.  ?Eyes:  ?   Conjunctiva/sclera: Conjunctivae normal.  ?Pulmonary:  ?   Effort: Pulmonary effort is normal. No respiratory distress.  ?Musculoskeletal:  ?   Comments: No focal tenderness to palpation of the knees. Full ROM of bilateral knees.   ?Skin: ?   General: Skin is warm and dry.  ?   Comments: Superficial abrasions to the bridge of the nose and bilateral knees. Bleeding is controlled. Contusion over the right eyebrow with no palpable skull deformity. Normal eyebrow raising.   ?Neurological:  ?   Mental Status: She is alert.  ?   Comments: Neuro: Speech is clear, able to follow commands. CN III-XII intact grossly intact. PERRLA. EOMI. Sensation intact throughout. Str 5/5 all extremities.  ?Psychiatric:     ?   Mood and Affect: Mood normal.     ?   Behavior: Behavior normal.  ? ? ?ED Results / Procedures / Treatments   ?Labs ?(all labs ordered are listed, but  only abnormal results are displayed) ?Labs Reviewed - No data to display ? ?EKG ?None ? ?Radiology ?No results found. ? ?Procedures ?Procedures  ? ? ?Medications Ordered in ED ?Medications - No data to display ? ?ED Course/ Medical Decision Making/ A&P ?  ?                        ?Medical Decision Making ? ?This patient is a 61 year old female who presents to the ED for concern of mechanical fall with facial trauma. She is not on chronic anticoagulation.  ? ?Differential diagnoses prior to evaluation: ?The emergent differential diagnosis includes, but is not limited to,  head trauma, facial trauma, intracranial hemorrhage, cervical  trauma, concussion. ? ?This is not an exhaustive differential.  ? ?Past Medical History / Co-morbidities: ?HTN ? ?Physical Exam: ?Physical exam performed. The pertinent findings include: Superficial abrasions to the bridge of the nose and bilateral knees. Bleeding is controlled. Contusion over the right eyebrow with no palpable skull deformity. Normal eyebrow raising. Normal neurologic exam as above. ? ?Disposition: ?After consideration of the diagnostic results and the patients response to treatment, I feel that patient's not requiring admission or inpatient treatment for her symptoms.  Based on [place] CT head rule the patient is not requiring CT imaging today as she had no loss of consciousness, amnesia, changes in mental status, nausea or vomiting.  GCS 15. She is not on chronic anticoagulation.  I have very low suspicion for acute intracranial hemorrhage or cervical injury today.  Discussed with the patient that it would be low yield imaging her face, as I have very low suspicion for acute facial fracture today.  Patient was mainly concerned that she had a concussion, and I explained to her that there is no one test to prove this or not.  We discussed keeping track of how she is doing, and following up with her primary doctor. We discussed reasons to return to the emergency department, and the patient is agreeable to the plan.  ? ?Final Clinical Impression(s) / ED Diagnoses ?Final diagnoses:  ?Fall, initial encounter  ?Facial injury, initial encounter  ? ? ?Rx / DC Orders ?ED Discharge Orders   ? ? None  ? ?  ? ?Portions of this report may have been transcribed using voice recognition software. Every effort was made to ensure accuracy; however, inadvertent computerized transcription errors may be present. ? ?  ?Kateri Plummer, PA-C ?12/06/21 1339 ? ?  ?Wyvonnia Dusky, MD ?12/06/21 1806 ? ?

## 2021-12-06 NOTE — Discharge Instructions (Addendum)
You were seen in the emergency department today for a fall. ? ?As we discussed I have very low suspicion that you broke anything today.  We do not have a clear-cut test to prove whether or not you have a concussion. ? ?I am reassured by the fact that you did not lose consciousness, you have full memory of the event, and you did not vomit.   ? ?I had like you to continue to monitor how you are doing and return to the emergency department for any worsening symptoms like we discussed.  I would also recommend following with your primary doctor about today's fall. ?
